# Patient Record
Sex: Female | Born: 1945
Health system: Southern US, Community
[De-identification: ages and names within clinical notes are randomized; demographics above are authoritative.]

## PROBLEM LIST (undated history)

## (undated) DIAGNOSIS — E039 Hypothyroidism, unspecified: Secondary | ICD-10-CM

## (undated) DIAGNOSIS — M8580 Other specified disorders of bone density and structure, unspecified site: Secondary | ICD-10-CM

## (undated) DIAGNOSIS — J189 Pneumonia, unspecified organism: Secondary | ICD-10-CM

## (undated) DIAGNOSIS — E079 Disorder of thyroid, unspecified: Secondary | ICD-10-CM

## (undated) DIAGNOSIS — K8010 Calculus of gallbladder with chronic cholecystitis without obstruction: Secondary | ICD-10-CM

## (undated) DIAGNOSIS — I341 Nonrheumatic mitral (valve) prolapse: Secondary | ICD-10-CM

## (undated) DIAGNOSIS — T7840XA Allergy, unspecified, initial encounter: Secondary | ICD-10-CM

## (undated) HISTORY — DX: Other specified disorders of bone density and structure, unspecified site: M85.80

## (undated) HISTORY — DX: Allergy, unspecified, initial encounter: T78.40XA

## (undated) HISTORY — PX: APPENDECTOMY: SHX54

## (undated) HISTORY — DX: Disorder of thyroid, unspecified: E07.9

## (undated) HISTORY — PX: TONSILLECTOMY AND ADENOIDECTOMY: SHX28

## (undated) HISTORY — PX: COLON SURGERY: SHX602

## (undated) HISTORY — PX: ABDOMINAL HYSTERECTOMY: SHX81

## (undated) HISTORY — DX: Nonrheumatic mitral (valve) prolapse: I34.1

---

## 1998-03-17 ENCOUNTER — Ambulatory Visit (HOSPITAL_COMMUNITY): Admission: RE | Admit: 1998-03-17 | Discharge: 1998-03-17 | Payer: Self-pay | Admitting: Obstetrics & Gynecology

## 1998-05-20 ENCOUNTER — Other Ambulatory Visit: Admission: RE | Admit: 1998-05-20 | Discharge: 1998-05-20 | Payer: Self-pay | Admitting: Obstetrics & Gynecology

## 1999-05-06 ENCOUNTER — Ambulatory Visit (HOSPITAL_COMMUNITY): Admission: RE | Admit: 1999-05-06 | Discharge: 1999-05-06 | Payer: Self-pay | Admitting: Obstetrics & Gynecology

## 1999-05-06 ENCOUNTER — Encounter: Payer: Self-pay | Admitting: Obstetrics & Gynecology

## 2000-05-16 ENCOUNTER — Encounter: Payer: Self-pay | Admitting: Obstetrics & Gynecology

## 2000-05-16 ENCOUNTER — Ambulatory Visit (HOSPITAL_COMMUNITY): Admission: RE | Admit: 2000-05-16 | Discharge: 2000-05-16 | Payer: Self-pay | Admitting: Obstetrics & Gynecology

## 2000-08-24 ENCOUNTER — Other Ambulatory Visit: Admission: RE | Admit: 2000-08-24 | Discharge: 2000-08-24 | Payer: Self-pay | Admitting: Obstetrics & Gynecology

## 2001-05-22 ENCOUNTER — Ambulatory Visit (HOSPITAL_COMMUNITY): Admission: RE | Admit: 2001-05-22 | Discharge: 2001-05-22 | Payer: Self-pay | Admitting: Obstetrics & Gynecology

## 2001-05-22 ENCOUNTER — Encounter: Payer: Self-pay | Admitting: Obstetrics & Gynecology

## 2001-08-30 ENCOUNTER — Other Ambulatory Visit: Admission: RE | Admit: 2001-08-30 | Discharge: 2001-08-30 | Payer: Self-pay | Admitting: Obstetrics & Gynecology

## 2002-07-09 ENCOUNTER — Ambulatory Visit (HOSPITAL_COMMUNITY): Admission: RE | Admit: 2002-07-09 | Discharge: 2002-07-09 | Payer: Self-pay | Admitting: Obstetrics & Gynecology

## 2002-07-09 ENCOUNTER — Encounter: Payer: Self-pay | Admitting: Obstetrics & Gynecology

## 2002-09-19 ENCOUNTER — Other Ambulatory Visit: Admission: RE | Admit: 2002-09-19 | Discharge: 2002-09-19 | Payer: Self-pay | Admitting: Obstetrics & Gynecology

## 2003-07-24 ENCOUNTER — Ambulatory Visit (HOSPITAL_COMMUNITY): Admission: RE | Admit: 2003-07-24 | Discharge: 2003-07-24 | Payer: Self-pay | Admitting: Obstetrics & Gynecology

## 2003-11-26 ENCOUNTER — Other Ambulatory Visit: Admission: RE | Admit: 2003-11-26 | Discharge: 2003-11-26 | Payer: Self-pay | Admitting: Obstetrics & Gynecology

## 2004-02-12 ENCOUNTER — Encounter (INDEPENDENT_AMBULATORY_CARE_PROVIDER_SITE_OTHER): Payer: Self-pay | Admitting: *Deleted

## 2004-03-11 ENCOUNTER — Encounter: Admission: RE | Admit: 2004-03-11 | Discharge: 2004-03-11 | Payer: Self-pay | Admitting: Endocrinology

## 2004-12-08 ENCOUNTER — Other Ambulatory Visit: Admission: RE | Admit: 2004-12-08 | Discharge: 2004-12-08 | Payer: Self-pay | Admitting: Obstetrics & Gynecology

## 2008-05-01 ENCOUNTER — Emergency Department (HOSPITAL_COMMUNITY): Admission: EM | Admit: 2008-05-01 | Discharge: 2008-05-01 | Payer: Self-pay | Admitting: Emergency Medicine

## 2008-05-12 ENCOUNTER — Encounter: Admission: RE | Admit: 2008-05-12 | Discharge: 2008-05-12 | Payer: Self-pay | Admitting: Family Medicine

## 2008-05-26 ENCOUNTER — Encounter: Admission: RE | Admit: 2008-05-26 | Discharge: 2008-05-26 | Payer: Self-pay | Admitting: Family Medicine

## 2008-06-16 ENCOUNTER — Encounter: Admission: RE | Admit: 2008-06-16 | Discharge: 2008-06-16 | Payer: Self-pay | Admitting: Family Medicine

## 2009-03-26 DIAGNOSIS — I059 Rheumatic mitral valve disease, unspecified: Secondary | ICD-10-CM | POA: Insufficient documentation

## 2009-03-26 DIAGNOSIS — E039 Hypothyroidism, unspecified: Secondary | ICD-10-CM | POA: Insufficient documentation

## 2009-05-01 ENCOUNTER — Ambulatory Visit: Payer: Self-pay | Admitting: Emergency Medicine

## 2009-05-01 DIAGNOSIS — R930 Abnormal findings on diagnostic imaging of skull and head, not elsewhere classified: Secondary | ICD-10-CM

## 2009-05-01 DIAGNOSIS — J189 Pneumonia, unspecified organism: Secondary | ICD-10-CM

## 2009-05-01 DIAGNOSIS — R0602 Shortness of breath: Secondary | ICD-10-CM | POA: Insufficient documentation

## 2009-05-13 LAB — CONVERTED CEMR LAB: A-1 Antitrypsin, Ser: 120 mg/dL (ref 83–200)

## 2009-05-21 ENCOUNTER — Ambulatory Visit: Payer: Self-pay | Admitting: Emergency Medicine

## 2009-05-27 ENCOUNTER — Encounter: Payer: Self-pay | Admitting: Emergency Medicine

## 2009-06-04 ENCOUNTER — Telehealth: Payer: Self-pay | Admitting: Emergency Medicine

## 2009-08-17 ENCOUNTER — Telehealth: Payer: Self-pay | Admitting: Emergency Medicine

## 2009-10-14 ENCOUNTER — Telehealth: Payer: Self-pay | Admitting: Gastroenterology

## 2009-12-04 ENCOUNTER — Encounter (INDEPENDENT_AMBULATORY_CARE_PROVIDER_SITE_OTHER): Payer: Self-pay | Admitting: *Deleted

## 2010-01-08 ENCOUNTER — Ambulatory Visit: Payer: Self-pay | Admitting: Gastroenterology

## 2010-01-08 ENCOUNTER — Encounter (INDEPENDENT_AMBULATORY_CARE_PROVIDER_SITE_OTHER): Payer: Self-pay | Admitting: *Deleted

## 2010-01-08 DIAGNOSIS — Z860100 Personal history of colon polyps, unspecified: Secondary | ICD-10-CM | POA: Insufficient documentation

## 2010-04-06 ENCOUNTER — Ambulatory Visit: Payer: Self-pay | Admitting: Gastroenterology

## 2010-04-06 ENCOUNTER — Telehealth: Payer: Self-pay | Admitting: Gastroenterology

## 2010-04-19 ENCOUNTER — Telehealth: Payer: Self-pay | Admitting: Gastroenterology

## 2010-08-15 ENCOUNTER — Encounter: Payer: Self-pay | Admitting: Family Medicine

## 2010-08-15 ENCOUNTER — Encounter: Payer: Self-pay | Admitting: Cardiology

## 2010-08-24 NOTE — Letter (Signed)
Summary: New Patient letter  [facility]  [address] [place], [place] [address]   Phone: [phone]  Fax: [phone]       12/04/2009 MRN: [id number]  Katelyn Anthony [address] [place], [place]  [address]  Dear Ms. Katelyn Anthony,  Welcome to the Gastroenterology Division at [facility].    You are scheduled to see Dr. Christella Hartigan on 01/08/2010 at 3:00PM on the 3rd floor at [facility], [address].  We ask that you try to arrive at our office 15 minutes prior to your appointment time to allow for check-in.  We would like you to complete the enclosed self-administered evaluation form prior to your visit and bring it with you on the day of your appointment.  We will review it with you.  Also, please bring a complete list of all your medications or, if you prefer, bring the medication bottles and we will list them.  Please bring your insurance card so that we may make a copy of it.  If your insurance requires a referral to see a specialist, please bring your referral form from your primary care physician.  Co-payments are due at the time of your visit and may be paid by cash, check or credit card.     Your office visit will consist of a consult with your physician (includes a physical exam), any laboratory testing he/she may order, scheduling of any necessary diagnostic testing (e.g. x-ray, ultrasound, CT-scan), and scheduling of a procedure (e.g. Endoscopy, Colonoscopy) if required.  Please allow enough time on your schedule to allow for any/all of these possibilities.    If you cannot keep your appointment, please call [phone] to cancel or reschedule prior to your appointment date.  This allows [place] the opportunity to schedule an appointment for another patient in need of care.  If you do not cancel or reschedule by 5 p.m. the business day prior to your appointment date, you will be charged a $50.00 late cancellation/no-show fee.    Thank you for choosing [facility] for your medical needs.  We appreciate the opportunity to care for you.  Please visit [place] at our website  to learn more about our practice.                     Sincerely,                                                             The Gastroenterology Division

## 2010-08-24 NOTE — Progress Notes (Signed)
Summary: Dr. Kandace Parkins  Phone Note Call from Patient Call back at Home Phone [phone]   Caller: Patient Call For: Dr. Christella Hartigan Reason for Call: Talk to Nurse Summary of Call: was Dr. Hilda Blades pt and now would like to be seen by someone new since Dr. Doreatha Martin has retired... pt has an immediate question as to why Dr. Doreatha Martin didnt think she needed a recall COL until 2013 when her last COL was 2006 and her mother passed from COL cancer pt also demanded that a doctor within our practice tell her where Dr. Maisie Fus Pullium is now practicing... told pt that Dr. Kandace Parkins is not listed in my physician directory and the only other thing I know to do is to research him online... pt said she has and there are "thousands of Dr. Cecilie Lowers" and she would like a simpler list or to be told where THE Dr. Kandace Parkins now practices... pt then says "I want you to go ask a doctor right now where Dr. Kandace Parkins is"... told pt that I would put her on hold and find a doctor that was not with a patient... without a doubt, pt was placed on hold and call was not dropped... picked the line back up to tell her that Dr. Leone Payor was in a room with a patient but that if she wantd to hold, I would ask when Dr. Leone Payor was out- pt was no longer on the line... pt then called back and was yelling that we had the worst and rudest staff and that "if I came up to your office I would fire each and every one of you" and "I dont know how yall even have a practice with such unprofessional staff"...  told pt that I was trying to help her as much as possible and that she hung up before I could get an answer from Dr. Leone Payor... told pt I would take a message if she didnt want to hold  Initial call taken by: Vallarie Mare,  October 14, 2009 2:07 PM  Follow-up for Phone Call        Dr Christella Hartigan do you want to take this pt?  Chales Abrahams [name] Duncan Dull)  October 14, 2009 2:12 PM   Additional Follow-up for Phone Call Additional follow up Details #1::        I do not know where Dr.  Kandace Parkins is and I am happy to see her as a new patient at my next available NGI appt to discuss her questions about colon cancer screening, surveillance, family history.  Additional Follow-up by: Rachael Fee MD,  October 14, 2009 2:40 PM    Additional Follow-up for Phone Call Additional follow up Details #2::    Revonda Standard he will accept this pt if you want to call her and get her scheduled, he does not know that Dr Melina Fiddler talking about. Follow-up by: Chales Abrahams [name] Duncan Dull),  October 14, 2009 2:54 PM  Additional Follow-up for Phone Call Additional follow up Details #3:: Details for Additional Follow-up Action Taken: Called pt and left a vm with our office phone number and information that Dr. Christella Hartigan will accept her as a new pt... also provided Dr. Christen Bame phone number [phone]) which I got from Dr. Arlyce Dice Additional Follow-up by: Vallarie Mare,  October 16, 2009 2:33 PM

## 2010-08-24 NOTE — Progress Notes (Signed)
Summary: CX Procedure  Phone Note Call from Patient Call back at Home Phone [phone]   Caller: Patient Call For: Dr Christella Hartigan Reason for Call: Talk to Doctor Details for Reason: Cx Procedure Summary of Call: Pt cancelled procedure for tomorrow. Stated she is "psychologically not prepared" and she is "too hungry" to prep today. Pt will callback to reschedule for 2012. Would you like to charge cancellation fee? Initial call taken by: Dwan Bolt,  April 06, 2010 1:36 PM  Follow-up for Phone Call        yes Follow-up by: Rachael Fee MD,  April 06, 2010 1:49 PM  Additional Follow-up for Phone Call Additional follow up Details #1::        Patient BILLED. Additional Follow-up by: Leanor Kail [facility],  April 19, 2010 1:19 PM

## 2010-08-24 NOTE — Assessment & Plan Note (Signed)
History of Present Illness Visit Type: Initial Visit Primary GI MD: Rob Bunting MD Primary Provider: Shaune Pollack, MD Chief Complaint: Katelyn Anthony hx of colon cancer and wants to discuss recall colon. Also has personal hx of polyps and diverticulosis.  History of Present Illness:     very pleasant 65 year old woman who has had adenomatous polyps in the past, also mother died from colon cancer.  she underwent a colonoscopy in 2005 with Dr. Corinda Gubler. No polyps were found at that time and she was told that she needs a colonoscopy at 8 year interval.  she recently brought this up with her gynecologist, Dr. Jennette Kettle, and she told him that she was concerned about this long interval. He sent her here for a visit.           Current Medications (verified): 1)  Synthroid 25 Mcg Tabs (Levothyroxine Sodium) .Marland Kitchen.. 1 1/2 By Mouth Daily 2)  Vivelle-Dot 0.075 Mg/24hr Pttw (Estradiol) .... Change Every 3-4 Days  Allergies (verified): 1)  ! Sulfa 2)  ! * Aleve 3)  ! Zithromax 4)  ! Augmentin 5)  ! Levaquin  Past History:  Past Medical History: PNEUMONIA (ICD-486) MITRAL VALVE PROLAPSE (ICD-424.0) HYPOTHYROIDISM (ICD-244.9) diverticulosis pre-cancerous colon polyps  Past Surgical History: Colon Resection July 1992, diverticlar complication; primarily anastomosed appendectomy 1992 hysterectomy  Family History: Family History Colon Cancer--mother in her late 81s lung ca-maternal aunt  Social History: Patient states former smoker. quit in 1970's.  <1/2ppd x39yrs.   alcohol occ single no children occupation-retail management  Review of Systems       Pertinent positive and negative review of systems were noted in the above HPI and GI specific review of systems.  All other review of systems was otherwise negative.   Vital Signs:  Patient profile:   65 year old female Height:      61.5 inches Weight:      88.50 pounds BMI:     16.51 Pulse rate:   74 / minute Pulse rhythm:    regular BP sitting:   118 / 64  (right arm) Cuff size:   74regular  Vitals Entered By: Christie Nottingham [name] Duncan Dull) (January 08, 2010 3:12 PM)  Physical Exam  Additional Exam:  Constitutional: generally well appearing Psychiatric: alert and oriented times 3 Eyes: extraocular movements intact Mouth: oropharynx moist, no lesions Neck: supple, no lymphadenopathy Cardiovascular: heart regular rate and rythm Lungs: CTA bilaterally Abdomen: soft, non-tender, non-distended, no obvious ascites, no peritoneal signs, normal bowel sounds Extremities: no lower extremity edema bilaterally Skin: no lesions on visible extremities    Impression & Recommendations:  Problem # 1:  Personal history of precancerous polyps, family history of colon cancer her previous gastroenterologist told her that she did not require colonoscopy for 8 years from her previous one in 2005. That is quite unusual interval for a person with a first-degree relative with colon cancer and personal history of adenomatous colon polyps. Currently accepted national polyp surveillance, colon screening guidelines recommended that she had a colonoscopy at five-year interval. We will therefore arrange for a colonoscopy to be done at her soonest convenience. I see no reason for any further blood tests or imaging studies.  Patient Instructions: 1)  You will be scheduled to have a colonoscopy. 2)  A copy of this information will be sent to Dr. Kevan Ny, Dr. Varney Baas. 3)  The medication list was reviewed and reconciled.  All changed / newly prescribed medications were explained.  A complete medication list was provided to  the patient / caregiver.  Appended Document: Orders Update    Clinical Lists Changes  Problems: Added new problem of COLONIC POLYPS, HX OF (ICD-V12.72) Medications: Added new medication of MOVIPREP 100 GM  SOLR (PEG-KCL-NACL-NASULF-NA ASC-C) As per prep instructions. - Signed Rx of MOVIPREP 100 GM  SOLR  (PEG-KCL-NACL-NASULF-NA ASC-C) As per prep instructions.;  #1 x 0;  Signed;  Entered by: Harlow Mares [name] (AAMA);  Authorized by: Rachael Fee MD;  Method used: Electronically to [id number] [address] [place], [place], [place]  [address], Ph: [phone], Fax: [phone] Orders: Added new Test order of Colonoscopy (Colon) - Signed    Prescriptions: MOVIPREP 100 GM  SOLR (PEG-KCL-NACL-NASULF-NA ASC-C) As per prep instructions.  #1 x 0   Entered by:   Harlow Mares [name] (AAMA)   Authorized by:   Rachael Fee MD   Signed by:   Harlow Mares [name] (AAMA) on 01/08/2010   Method used:   Electronically to        [id number] (retail)       [address]       [place], [place]  [address]       Ph: [phone]       Fax: [phone]   RxID:   [id number]

## 2010-08-24 NOTE — Letter (Signed)
Summary: [facility] Instructions  [facility]  [address] [place], [place] [address]   Phone: [phone]  Fax: [phone]       Katelyn Anthony    28-Sep-1945    MRN: [id number]        Procedure Day /Date: 04/07/2010 Wednesday     Arrival Time: 7:30am     Procedure Time: 8:30am     Location of Procedure:                    X  [facility] (4th Floor)                        PREPARATION FOR COLONOSCOPY WITH MOVIPREP   Starting 5 days prior to your procedure 04/02/2010 do not eat nuts, seeds, popcorn, corn, beans, peas,  salads, or any raw vegetables.  Do not take any fiber supplements (e.g. Metamucil, Citrucel, and Benefiber).  THE DAY BEFORE YOUR PROCEDURE         DATE: 04/06/2010  DAY: Tuesday  1.  Drink clear liquids the entire day-NO SOLID FOOD  2.  Do not drink anything colored red or purple.  Avoid juices with pulp.  No orange juice.  3.  Drink at least 64 oz. (8 glasses) of fluid/clear liquids during the day to prevent dehydration and help the prep work efficiently.  CLEAR LIQUIDS INCLUDE: Water Jello Ice Popsicles Tea (sugar ok, no milk/cream) Powdered fruit flavored drinks Coffee (sugar ok, no milk/cream) Gatorade Juice: apple, white grape, white cranberry  Lemonade Clear bullion, consomm, broth Carbonated beverages (any kind) Strained chicken noodle soup Hard Candy                             4.  In the morning, mix first dose of MoviPrep solution:    Empty 1 Pouch A and 1 Pouch B into the disposable container    Add lukewarm drinking water to the top line of the container. Mix to dissolve    Refrigerate (mixed solution should be used within 24 hrs)  5.  Begin drinking the prep at 5:00 p.m. The MoviPrep container is divided by 4 marks.   Every 15 minutes drink the solution down to the next mark (approximately 8 oz) until the full liter is complete.   6.  Follow completed prep with 16 oz of clear liquid of your choice (Nothing  red or purple).  Continue to drink clear liquids until bedtime.  7.  Before going to bed, mix second dose of MoviPrep solution:    Empty 1 Pouch A and 1 Pouch B into the disposable container    Add lukewarm drinking water to the top line of the container. Mix to dissolve    Refrigerate  THE DAY OF YOUR PROCEDURE      DATE: 04/07/2010 DAY: Thursday  Beginning at 3:30am (5 hours before procedure):         1. Every 15 minutes, drink the solution down to the next mark (approx 8 oz) until the full liter is complete.  2. Follow completed prep with 16 oz. of clear liquid of your choice.    3. You may drink clear liquids until 5:30am (2 HOURS BEFORE PROCEDURE).   MEDICATION INSTRUCTIONS  Unless otherwise instructed, you should take regular prescription medications with a small sip of water   as early as possible the morning of your procedure.  OTHER INSTRUCTIONS  You will need a responsible adult at least 65 years of age to accompany you and drive you home.   This person must remain in the waiting room during your procedure.  Wear loose fitting clothing that is easily removed.  Leave jewelry and other valuables at home.  However, you may wish to bring a book to read or  an iPod/MP3 player to listen to music as you wait for your procedure to start.  Remove all body piercing jewelry and leave at home.  Total time from sign-in until discharge is approximately 2-3 hours.  You should go home directly after your procedure and rest.  You can resume normal activities the  day after your procedure.  The day of your procedure you should not:   Drive   Make legal decisions   Operate machinery   Drink alcohol   Return to work  You will receive specific instructions about eating, activities and medications before you leave.    The above instructions have been reviewed and explained to me by   Harlow Mares [name] Duncan Dull)  January 08, 2010 3:41 PM      I fully understand  and can verbalize these instructions _____________________________ Date 01/08/2010

## 2010-08-24 NOTE — Progress Notes (Signed)
Summary: Question  Phone Note Call from Patient Call back at Home Phone [phone]   Caller: Patient Call For: Dr. Christella Hartigan Reason for Call: Talk to Nurse Summary of Call: Has some questions about the prep for a COL Initial call taken by: Karna Christmas,  April 19, 2010 4:17 PM  Follow-up for Phone Call        no answer on home phone Chales Abrahams [name] Duncan Dull)  April 20, 2010 8:05 AM   Spoke with the pt and she wanted to see if there were any other preps that you do not need to drink.  I advised her that there are no other preps available and the movi prep is the best prep for visualization.  She was very understandable and agreed to use the movi prep. Follow-up by: Chales Abrahams [name] Duncan Dull),  April 21, 2010 8:39 AM

## 2010-08-24 NOTE — Progress Notes (Signed)
Summary: Schedule appt with RB-appt has been made  ---- Converted from flag ---- ---- 08/14/2009 5:10 PM, Michel Bickers [name] wrote: Pt NOS f/u with RB to discuss PFT results. She needs to rsc. ------------------------------  Phone Note Outgoing Call   Call placed by: Michel Bickers [name],  August 17, 2009 1:06 PM Call placed to: Patient Summary of Call: LMTCB. Need to rechecule missed appt from 08/14/09 to discuss PFT results with Dr. Delton Coombes. Initial call taken by: Michel Bickers [name],  August 17, 2009 1:07 PM  Follow-up for Phone Call        [facility].Michel Bickers [name]  August 18, 2009 3:19 PM  [facility].Michel Bickers [facility]  August 20, 2009 4:49 PM  pt called back. i made a f/u appt w/ rb for pft results. pt listed "many days" that were good for her but were not avail. she will return for rov on fri 10/09/09. if this date is too far out - if dr Kyle Luppino wants her in sooner let me know. otherwise, pt will come in 10/09/09. Tivis Ringer, CNA  August 21, 2009 11:05 AM  Additional Follow-up for Phone Call Additional follow up Details #1::        I will forward to RB so he is aware.Michel Bickers [facility]  August 21, 2009 2:15 PM   Thanks Leslye Peer MD  August 25, 2009 3:27 PM  Additional Follow-up by: Leslye Peer MD,  August 25, 2009 3:27 PM

## 2010-08-24 NOTE — Procedures (Signed)
Summary: Colon   Colonoscopy  Procedure date:  02/12/2004  Findings:      Location:  [facility].   Patient Name: Katelyn Anthony, Katelyn Anthony MRN:  Procedure Procedures: Colonoscopy CPT: [id number].  Personnel: Endoscopist: Ulyess Mort, MD.  Exam Location: Exam performed in Outpatient Clinic. Outpatient  Patient Consent: Procedure, Alternatives, Risks and Benefits discussed, consent obtained, from patient. Consent was obtained by the RN.  Indications  Surveillance of: Adenomatous Polyp(s).  History  Pre-Exam Physical: Entire physical exam was normal.  Exam Exam: Extent of exam reached: Cecum, extent intended: Cecum.  The cecum was identified by appendiceal orifice and IC valve. Colon retroflexion performed. Images were not taken. ASA Classification: II. Tolerance: good.  Monitoring: Pulse and BP monitoring, Oximetry used. Supplemental O2 given.  Colon Prep Prep results: good.  Sedation Meds: Patient assessed and found to be appropriate for moderate (conscious) sedation. Fentanyl 50 mcg. given IV. Versed 7 mg. given IV.  Findings - PRIOR SURGERY: Hepatic Flexure. Right Hemicolectomy.  - DIVERTICULOSIS: Transverse Colon to Rectum. ICD9: Diverticulosis: 562.10. Comments: mild.   Assessment Abnormal examination, see findings above.  Diagnoses: 562.10: Diverticulosis.   Events  Unplanned Interventions: No intervention was required.  Unplanned Events: There were no complications. Plans Medication Plan: Continue current medications.  Patient Education: Patient given standard instructions for: Diverticulosis. Yearly hemoccult testing recommended. Patient instructed to get routine colonoscopy every 8 years.  Disposition: After procedure patient sent to recovery. After recovery patient sent home.

## 2010-09-28 ENCOUNTER — Encounter (INDEPENDENT_AMBULATORY_CARE_PROVIDER_SITE_OTHER): Payer: Self-pay | Admitting: *Deleted

## 2010-10-05 NOTE — Letter (Signed)
Summary: Pre Visit Letter Revised  [facility]  [address] [place], [place] [address]   Phone: [phone]  Fax: [phone]        09/28/2010 MRN: [id number] Katelyn Anthony [address] [place], [place]  [address]             Procedure Date:  11/09/2010 @ 10:30   Recall colon-Dr. Christella Hartigan   Welcome to the Gastroenterology Division at [facility].    You are scheduled to see a nurse for your pre-procedure visit on 10/15/2010 at 3:30 on the 3rd floor at [facility], [address]  We ask that you try to arrive at our office 15 minutes prior to your appointment time to allow for check-in.  Please take a minute to review the attached form.  If you answer "Yes" to one or more of the questions on the first page, we ask that you call the person listed at your earliest opportunity.  If you answer "No" to all of the questions, please complete the rest of the form and bring it to your appointment.    Your nurse visit will consist of discussing your medical and surgical history, your immediate family medical history, and your medications.   If you are unable to list all of your medications on the form, please bring the medication bottles to your appointment and we will list them.  We will need to be aware of both prescribed and over the counter drugs.  We will need to know exact dosage information as well.    Please be prepared to read and sign documents such as consent forms, a financial agreement, and acknowledgement forms.  If necessary, and with your consent, a friend or relative is welcome to sit-in on the nurse visit with you.  Please bring your insurance card so that we may make a copy of it.  If your insurance requires a referral to see a specialist, please bring your referral form from your primary care physician.  No co-pay is required for this nurse visit.     If you cannot keep your appointment, please call [phone] to cancel or reschedule prior to your  appointment date.  This allows [place] the opportunity to schedule an appointment for another patient in need of care.    Thank you for choosing [facility] for your medical needs.  We appreciate the opportunity to care for you.  Please visit [place] at our website  to learn more about our practice.  Sincerely, The Gastroenterology Division

## 2010-10-13 ENCOUNTER — Ambulatory Visit (AMBULATORY_SURGERY_CENTER): Payer: 59 | Admitting: *Deleted

## 2010-10-13 VITALS — Ht 62.0 in | Wt 88.0 lb

## 2010-10-13 DIAGNOSIS — Z86010 Personal history of colonic polyps: Secondary | ICD-10-CM

## 2010-10-13 MED ORDER — PEG-KCL-NACL-NASULF-NA ASC-C 100 G PO SOLR: 1.0000 | Freq: Once | ORAL | Status: AC

## 2010-10-14 ENCOUNTER — Encounter: Payer: Self-pay | Admitting: Gastroenterology

## 2010-10-15 ENCOUNTER — Encounter: Payer: Self-pay | Admitting: *Deleted

## 2010-11-09 ENCOUNTER — Ambulatory Visit: Payer: 59 | Admitting: Gastroenterology

## 2010-11-09 ENCOUNTER — Encounter: Payer: Self-pay | Admitting: Gastroenterology

## 2010-11-09 VITALS — BP 167/74 | HR 85 | Temp 98.0°F | Resp 20

## 2010-11-09 DIAGNOSIS — K5730 Diverticulosis of large intestine without perforation or abscess without bleeding: Secondary | ICD-10-CM

## 2010-11-09 DIAGNOSIS — Q438 Other specified congenital malformations of intestine: Secondary | ICD-10-CM

## 2010-11-09 DIAGNOSIS — Z800 Family history of malignant neoplasm of digestive organs: Secondary | ICD-10-CM

## 2010-11-09 DIAGNOSIS — Z86010 Personal history of colonic polyps: Secondary | ICD-10-CM

## 2010-11-09 MED ORDER — SODIUM CHLORIDE 0.9 % IV SOLN: 500.0000 mL | INTRAVENOUS | Status: DC

## 2010-11-09 NOTE — Patient Instructions (Signed)
Incomplete exam  Diverticulosis.  Dr. Christella Hartigan office will arrange for virtual colonoscopy.  See green and blue sheets for additional d/c instructions.

## 2010-11-10 ENCOUNTER — Telehealth: Payer: Self-pay | Admitting: *Deleted

## 2010-11-10 NOTE — Telephone Encounter (Signed)
ID on answering machine. Left message to call back if experiencing any problems or has questions

## 2010-11-11 ENCOUNTER — Telehealth: Payer: Self-pay

## 2010-11-11 DIAGNOSIS — Z800 Family history of malignant neoplasm of digestive organs: Secondary | ICD-10-CM

## 2010-11-11 NOTE — Telephone Encounter (Signed)
[facility] called to set up virtual colon recommended by Dr Christella Hartigan from her incomplete colon.

## 2010-11-11 NOTE — Telephone Encounter (Signed)
Records sent to [facility] for review and Insurance approval they will call the pt and our office if/when scheduled.

## 2010-11-12 ENCOUNTER — Other Ambulatory Visit: Payer: Self-pay | Admitting: Gastroenterology

## 2010-11-12 DIAGNOSIS — K5730 Diverticulosis of large intestine without perforation or abscess without bleeding: Secondary | ICD-10-CM

## 2010-11-17 ENCOUNTER — Other Ambulatory Visit: Payer: Self-pay | Admitting: Gastroenterology

## 2010-11-17 DIAGNOSIS — Z800 Family history of malignant neoplasm of digestive organs: Secondary | ICD-10-CM

## 2010-11-17 DIAGNOSIS — K635 Polyp of colon: Secondary | ICD-10-CM

## 2011-01-27 ENCOUNTER — Ambulatory Visit
Admission: RE | Admit: 2011-01-27 | Discharge: 2011-01-27 | Disposition: A | Discharge status: Home / Self Care | Payer: 59 | Source: Ambulatory Visit | Attending: Family Medicine | Admitting: Family Medicine

## 2011-01-27 ENCOUNTER — Other Ambulatory Visit: Payer: Self-pay | Admitting: Family Medicine

## 2011-01-27 DIAGNOSIS — R609 Edema, unspecified: Secondary | ICD-10-CM

## 2011-01-27 DIAGNOSIS — R52 Pain, unspecified: Secondary | ICD-10-CM

## 2011-03-03 ENCOUNTER — Telehealth: Payer: Self-pay | Admitting: Gastroenterology

## 2011-03-03 NOTE — Telephone Encounter (Signed)
lmom for pt to call back

## 2011-03-03 NOTE — Telephone Encounter (Signed)
Pt has questions about the Virtual Colonoscopy and what would we be looking for. Explained to pt during her COLON of 11/09/10, Dr Christella Hartigan was unable to complete her exam. He was able to examine the Sigmoid and Descending Segments, but d/t her redundant COLON and numerous position changes and different scopes, he could not examine the entire colon. Pt stated she had other procedures and polyps removed in the past and couldn't we do a "Sig" something that Dr Doreatha Martin did before. Explained her Sigmoid was ok per Dr Christella Hartigan notes. Read information to pt about the Virtual Colon and that air would be inserted through a rectal tube to expand the colon for viewing. Also informed her there are false positives and this exam isn't as reliable as the COLONOSCOPY. She wants to know if the Virtual is painful and if she's be "knocked out"? I will call about the procedure and let her know in am. Dr Christella Hartigan, pt wants to know what you will be looking for and if something is found, what is next? COLON with propofol? Please advise. Thanks.

## 2011-03-04 NOTE — Telephone Encounter (Signed)
She will not be knocked out during VC, may be slightly uncomfortable only.  If a signficant polyp or lesion is found, then another try at colonoscopy with propofol.

## 2011-03-04 NOTE — Telephone Encounter (Signed)
Spoke with Alvino Chapel at [facility] who stated a small tube is inserted into the rectum and the colon is filled with CO2 to expand the COLON and the tube is removed for the procedure. There is discomfort from the bloating only.  Per Dr Christella Hartigan' note, if anything is found he will try another COLON with Propofol. lmom for pt to call.

## 2011-03-04 NOTE — Telephone Encounter (Signed)
Informed pt of VC procedures and of Dr Christella Hartigan' note about a COLON with propofol. Pt was also concerned that Dr Christella Hartigan may no know of her Colectomy about 15 years ago. Explained to pt Dr Christella Hartigan mentioned in the COLON report done 11/09/10 that " the remote sigmoid anastomosis from complex diverticular disease was not visible on this exam" so he is aware of the surgery. Informed her she may have a little discomfort d/t the bloating, but she should be ok for the procedure. Pt will call to r/s the VC.

## 2011-04-20 ENCOUNTER — Other Ambulatory Visit: Payer: 59

## 2011-04-21 ENCOUNTER — Other Ambulatory Visit: Payer: 59

## 2011-09-21 ENCOUNTER — Inpatient Hospital Stay: Admission: RE | Admit: 2011-09-21 | Payer: 59 | Source: Ambulatory Visit

## 2011-09-23 ENCOUNTER — Ambulatory Visit
Admission: RE | Admit: 2011-09-23 | Discharge: 2011-09-23 | Disposition: A | Discharge status: Home / Self Care | Payer: 59 | Source: Ambulatory Visit | Attending: Gastroenterology | Admitting: Gastroenterology

## 2011-09-23 DIAGNOSIS — K5730 Diverticulosis of large intestine without perforation or abscess without bleeding: Secondary | ICD-10-CM

## 2011-10-18 ENCOUNTER — Ambulatory Visit (INDEPENDENT_AMBULATORY_CARE_PROVIDER_SITE_OTHER): Payer: 59 | Admitting: Gastroenterology

## 2011-10-18 ENCOUNTER — Encounter: Payer: Self-pay | Admitting: Gastroenterology

## 2011-10-18 DIAGNOSIS — Z86010 Personal history of colonic polyps: Secondary | ICD-10-CM

## 2011-10-18 DIAGNOSIS — Z809 Family history of malignant neoplasm, unspecified: Secondary | ICD-10-CM

## 2011-10-18 NOTE — Patient Instructions (Signed)
Recall colonoscopy in 5 years. Will try traditional colonoscopy given your very unpleasant experience with the Virtual Colonoscopy (would try with propofol sedation).

## 2011-10-18 NOTE — Progress Notes (Signed)
Review of pertinent gastrointestinal problems: 1. personal history of adenomatous polyps, mother had colon cancer. Most recent colonoscopy April 2012 was incomplete to 2 long tortuous colon. I could not advance the colonoscope past the transverse colon. I recommended followup virtual colonoscopy which she finally did about 11 months after I recommended it; no polyps were noted.   HPI: This is a very pleasant 66 year old woman whom I last saw about a year ago.   she finally went through with the virtual colonoscopy which I ordered for her about a year ago.  She had virtual CT last month, very uncomfortable.  She was not happy with the whole process, uncomfortable with the air.   No overt Gi bleeding, just her usual irregular.     Past Medical History  Diagnosis Date  . Allergy     feathers  . Cataract   . Thyroid disease   . Osteopenia   . MVP (mitral valve prolapse)     Past Surgical History  Procedure Date  . Abdominal hysterectomy   . Colon surgery     diverticulitis  . Tonsillectomy and adenoidectomy   . Appendectomy     Current Outpatient Prescriptions  Medication Sig Dispense Refill  . Cholecalciferol (VITAMIN D3) 2000 UNITS TABS Take 2,000 Units by mouth daily.        Marland Kitchen levothyroxine (SYNTHROID, LEVOTHROID) 25 MCG tablet Take 40.5 mcg by mouth daily.        Marland Kitchen VIVELLE-DOT 0.075 MG/24HR         Allergies as of 10/18/2011 - Review Complete 10/18/2011  Allergen Reaction Noted  . Azithromycin    . ZOX:[id number] acid+aspartame    . Levofloxacin    . Naproxen sodium    . Sulfonamide derivatives      Family History  Problem Relation Age of Onset  . Colon cancer Mother     History   Social History  . Marital Status: Single    Spouse Name: N/A    Number of Children: N/A  . Years of Education: N/A   Occupational History  . Not on file.   Social History Main Topics  . Smoking status: Former Games developer  . Smokeless tobacco: Not on file  .  Alcohol Use: 1.2 oz/week    2 Glasses of wine per week  . Drug Use: No  . Sexually Active: Not on file   Other Topics Concern  . Not on file   Social History Narrative  . No narrative on file      Physical Exam: BP 132/78  Pulse 60  Ht 5\' 1"  (1.549 m)  Wt 90 lb (40.824 kg)  BMI 17.01 kg/m2 Constitutional: generally well-appearing Psychiatric: alert and oriented x3 Abdomen: soft, nontender, nondistended, no obvious ascites, no peritoneal signs, normal bowel sounds     Assessment and plan: 66 y.o. female with  elevated risk for colon cancer given the personal history of polyps and family history of colon cancer  The virtual colonoscopy was a completely miserable experience for her. I think we will try again at standard endoscopic colonoscopy in 5 years from now. At that time I would at least use deeper sedation to try to get all the way around her colon. She knows that if she has any significant changes in her bowel habits or serious bleeding to call sooner.

## 2012-02-02 ENCOUNTER — Encounter: Payer: Self-pay | Admitting: Gastroenterology

## 2013-01-23 ENCOUNTER — Telehealth: Payer: Self-pay | Admitting: *Deleted

## 2013-01-23 NOTE — Telephone Encounter (Signed)
She needs to continue the same dose, she may feel tired for a few days since he is coming off very high doses. Do not  change until another 2-3 weeks

## 2013-01-23 NOTE — Telephone Encounter (Signed)
Left message on home phone with instructions

## 2013-01-23 NOTE — Telephone Encounter (Signed)
Pt called asking if Dr Lucianne Muss would agree for her to take her Synthroid 25 mg every other day, instead of 1/2 tablet every day. Pt states she is still feeling draggy and tired. Please return call to [phone]. Please advise.

## 2013-01-24 ENCOUNTER — Ambulatory Visit: Payer: 59 | Admitting: Endocrinology

## 2013-02-15 ENCOUNTER — Other Ambulatory Visit: Payer: Self-pay | Admitting: Endocrinology

## 2013-02-15 ENCOUNTER — Telehealth: Payer: Self-pay | Admitting: *Deleted

## 2013-02-15 NOTE — Telephone Encounter (Signed)
Need to see the actual labs

## 2013-02-15 NOTE — Telephone Encounter (Signed)
Left message on voicemail to return call and have her labs faxed over here.

## 2013-02-15 NOTE — Telephone Encounter (Signed)
Patient had labs done at [facility] and they told her that her TSH was low, she said half of the .125 isn't enough and she wants to know if she can take the .100?

## 2013-02-19 ENCOUNTER — Telehealth: Payer: Self-pay | Admitting: Endocrinology

## 2013-02-19 NOTE — Telephone Encounter (Signed)
Lab results from 02/06/13 showed TSH 0.07  She is getting too much medication, need to know exactly what dosage of Synthroid she is taking

## 2013-02-19 NOTE — Telephone Encounter (Signed)
Left message to return phone call.

## 2013-02-20 NOTE — Telephone Encounter (Signed)
Please confirm that she has been on half tablet a day since the level is very high  If she has been on half for at least 3 weeks she will reduced the dose to 50 mcg, 1 daily   Need to see her in 4 weeks with labs

## 2013-02-20 NOTE — Telephone Encounter (Signed)
Pt states she is taking a half of the .125 mg

## 2013-02-25 ENCOUNTER — Telehealth: Payer: Self-pay | Admitting: *Deleted

## 2013-02-25 NOTE — Telephone Encounter (Signed)
TSH being low means she is getting too much medication, she will need to take 50 mcg daily and see me in about a month with labs

## 2013-02-25 NOTE — Telephone Encounter (Signed)
Pt says she doesn't want you to go off the results of the tests you did, she said [facility] did her TSH and she said she wanted you to look at those labs and then decide what dose she should be on.

## 2013-02-26 ENCOUNTER — Other Ambulatory Visit: Payer: Self-pay | Admitting: *Deleted

## 2013-02-26 MED ORDER — LEVOTHYROXINE SODIUM 50 MCG PO TABS: 50.0000 ug | ORAL_TABLET | Freq: Every day | ORAL | Status: DC

## 2013-02-26 NOTE — Telephone Encounter (Signed)
Message left, rx sent.

## 2013-02-28 ENCOUNTER — Telehealth: Payer: Self-pay | Admitting: *Deleted

## 2013-02-28 NOTE — Telephone Encounter (Signed)
Left message for patient

## 2013-02-28 NOTE — Telephone Encounter (Signed)
Katelyn Anthony said she has been on the 50's before and was unable to sleep while she was on it, she wants to know if she can take the 100 mcg instead? Please advise

## 2013-02-28 NOTE — Telephone Encounter (Signed)
She cannot increase her medication right now because she was getting too much medication on the 62.5 mcg daily.

## 2013-04-11 ENCOUNTER — Other Ambulatory Visit: Payer: Self-pay | Admitting: *Deleted

## 2013-04-11 ENCOUNTER — Other Ambulatory Visit (INDEPENDENT_AMBULATORY_CARE_PROVIDER_SITE_OTHER): Payer: 59

## 2013-04-11 DIAGNOSIS — E039 Hypothyroidism, unspecified: Secondary | ICD-10-CM

## 2013-04-11 LAB — T4, FREE: Free T4: 0.81 ng/dL (ref 0.60–1.60)

## 2013-04-11 LAB — TSH: TSH: 2.78 u[IU]/mL (ref 0.35–5.50)

## 2013-04-12 ENCOUNTER — Ambulatory Visit (INDEPENDENT_AMBULATORY_CARE_PROVIDER_SITE_OTHER): Payer: 59 | Admitting: Endocrinology

## 2013-04-12 ENCOUNTER — Encounter: Payer: Self-pay | Admitting: Endocrinology

## 2013-04-12 VITALS — BP 138/80 | HR 56 | Temp 98.5°F | Resp 10 | Ht 61.5 in | Wt 90.5 lb

## 2013-04-12 DIAGNOSIS — E039 Hypothyroidism, unspecified: Secondary | ICD-10-CM

## 2013-04-12 DIAGNOSIS — L659 Nonscarring hair loss, unspecified: Secondary | ICD-10-CM

## 2013-04-12 NOTE — Progress Notes (Signed)
Patient ID: ANTONAE ZBIKOWSKI, female   DOB: [birth year], 67 y.o.   MRN: [id number]  Reason for Appointment:  Hypothyroidism, followup visit    History of Present Illness:   The hypothyroidism was first diagnosed in 2001 presenting with fatigue She had mild hypothyroidism at that time and could not tolerate more than a 25 mcg dose because of insomnia and palpitations Also at that time she had a twice normal enlarged thyroid gland However over the last year or so she has regular required larger doses for supplementation. Highest TSH has been about 9.7  She was seen several months ago and because of fatigue and high TSH was told to try a half of 125 mcg supplement However her TSH was low with this and she was told to go back to the 50 mcg On her own she had been taking the 125 for some time by mistake and she felt more energetic with this  Complaints are reported by the patient now are some fatigue. Also has had hair loss for quite some time but more in the last 2 months including some clumps of hair coming out               Compliance with the medical regimen has been as prescribed with taking the tablet in the morning before breakfast with water.  Appointment on 04/11/2013  Component Date Value Range Status  . Free T4 04/11/2013 0.81  0.60 - 1.60 ng/dL Final  . TSH 40/98/1191 2.78  0.35 - 5.50 uIU/mL Final      Medication List       This list is accurate as of: 04/12/13 11:59 PM.  Always use your most recent med list.               levothyroxine 50 MCG tablet  Commonly known as:  SYNTHROID, LEVOTHROID  Take 1 tablet (50 mcg total) by mouth daily.     Vitamin D3 2000 UNITS Tabs  Take 2,000 Units by mouth daily.     VIVELLE-DOT 0.075 MG/24HR  Generic drug:  estradiol        Past Medical History  Diagnosis Date  . Allergy     feathers  . Cataract   . Thyroid disease   . Osteopenia   . MVP (mitral valve prolapse)     Past Surgical History  Procedure Laterality Date  .  Abdominal hysterectomy    . Colon surgery      diverticulitis  . Tonsillectomy and adenoidectomy    . Appendectomy      Family History  Problem Relation Age of Onset  . Colon cancer Mother     Social History:  reports that she has quit smoking. She does not have any smokeless tobacco history on file. She reports that she drinks about 1.2 ounces of alcohol per week. She reports that she does not use illicit drugs.  Allergies:  Allergies  Allergen Reactions  . Amoxicillin-Pot Clavulanate     REACTION: diarrhea, muscle pain  . Azithromycin     REACTION: hives  . Levofloxacin     REACTION: itchy skin and bloody stools  . Naproxen Sodium     REACTION: rash  . Sulfonamide Derivatives     combative   ROS  No insomnia    Examination:   BP 138/80  Pulse 56  Temp(Src) 98.5 F (36.9 C)  Resp 10  Ht 5' 1.5" (1.562 m)  Wt 90 lb 8 oz (41.051 kg)  BMI 16.83 kg/m2  SpO2 98%   GENERAL APPEARANCE: Alert And looks well.    FACE: No puffiness of face. No swelling of hands    NECK: right side of thyroid is enlarged, nodular with mostly a 2 cm nodule felt          NEUROLOGIC EXAM: DTRs 2+ bilaterally at biceps.no tremor    Assessments   Hypothyroidism, long-standing associated with nodular goiter  Currently she is euthyroid with normal TSH while taking 50 mcg Her right-sided thyroid enlargement is relatively smaller than before HAIR loss: this may be related to her thyroid levels fluctuating significantly and she having some iatrogenic hyperthyroidism earlier this year   Treatment:   Continue same dosage before breakfast daily. Avoid taking any calcium or iron supplements with the thyroid supplement. If the hair loss is not better in a couple of months she can see a dermatologist    [facility] 04/14/2013, 9:55 PM

## 2013-04-15 ENCOUNTER — Ambulatory Visit: Payer: 59 | Admitting: Endocrinology

## 2013-04-25 ENCOUNTER — Other Ambulatory Visit: Payer: Self-pay | Admitting: *Deleted

## 2013-04-25 ENCOUNTER — Telehealth: Payer: Self-pay | Admitting: Endocrinology

## 2013-04-25 MED ORDER — LEVOTHYROXINE SODIUM 50 MCG PO TABS: 50.0000 ug | ORAL_TABLET | Freq: Every day | ORAL | Status: DC

## 2013-04-25 NOTE — Telephone Encounter (Signed)
Refill, Synthroid. Has already contacted pharmacy. Pharmacy is faxing info / [facility]. Patient has asked a return call / Sherri

## 2013-04-25 NOTE — Telephone Encounter (Signed)
rx sent

## 2013-07-28 ENCOUNTER — Other Ambulatory Visit: Payer: Self-pay | Admitting: Endocrinology

## 2013-07-30 ENCOUNTER — Other Ambulatory Visit (INDEPENDENT_AMBULATORY_CARE_PROVIDER_SITE_OTHER): Payer: 59

## 2013-07-30 DIAGNOSIS — E039 Hypothyroidism, unspecified: Secondary | ICD-10-CM

## 2013-07-30 LAB — T4, FREE: FREE T4: 0.78 ng/dL (ref 0.60–1.60)

## 2013-07-30 LAB — TSH: TSH: 2.25 u[IU]/mL (ref 0.35–5.50)

## 2013-08-01 ENCOUNTER — Ambulatory Visit (INDEPENDENT_AMBULATORY_CARE_PROVIDER_SITE_OTHER): Payer: 59 | Admitting: Endocrinology

## 2013-08-01 ENCOUNTER — Encounter: Payer: Self-pay | Admitting: Endocrinology

## 2013-08-01 ENCOUNTER — Other Ambulatory Visit: Payer: 59 | Admitting: *Deleted

## 2013-08-01 VITALS — BP 120/62 | HR 53 | Temp 98.0°F | Resp 12 | Ht 61.5 in | Wt 92.5 lb

## 2013-08-01 DIAGNOSIS — E039 Hypothyroidism, unspecified: Secondary | ICD-10-CM

## 2013-08-01 DIAGNOSIS — E042 Nontoxic multinodular goiter: Secondary | ICD-10-CM

## 2013-08-01 DIAGNOSIS — Z23 Encounter for immunization: Secondary | ICD-10-CM

## 2013-08-01 NOTE — Patient Instructions (Signed)
Continue same dosage before breakfast daily.   Avoid taking any calcium or iron supplements with the thyroid supplement. 

## 2013-08-01 NOTE — Progress Notes (Signed)
Patient ID: Katelyn Anthony, female   DOB: [birth year], 68 y.o.   MRN: [id number]  Reason for Appointment:  Hypothyroidism, followup visit    History of Present Illness:   The hypothyroidism was first diagnosed in 2001 presenting with fatigue She had mild hypothyroidism at that time and could not tolerate more than a 25 mcg dose because of insomnia and palpitations Also at that time she had a twice normal enlarged nodular thyroid gland However since about 2013 she has regular required larger doses of levothyroxine for supplementation.  Highest TSH has been 9.7 previously  She had a high TSH on 50 mcg in 2014 and was given a trial of half of 125 mcg supplement On her own she had been taking the full tablet of 125 mcg  by mistake and she felt more energetic with this. However because of low TSH this was changed back to 50 mcg and TSH was normal on this in 03/2013  Complaints are reported by the patient now are some fatigue which is unchanged.  Also has had hair loss for quite some time which is not improving. Has not seen dermatologist as advised          Compliance with the medical regimen has been as prescribed with taking the tablet in the morning before breakfast with water.  Appointment on 07/30/2013  Component Date Value Range Status  . TSH 07/30/2013 2.25  0.35 - 5.50 uIU/mL Final  . Free T4 07/30/2013 0.78  0.60 - 1.60 ng/dL Final      Medication List       This list is accurate as of: 08/01/13  4:08 PM.  Always use your most recent med list.               levothyroxine 50 MCG tablet  Commonly known as:  SYNTHROID, LEVOTHROID  TAKE 1 TABLET BY MOUTH ONCE DAILY     VIVELLE-DOT 0.075 MG/24HR  Generic drug:  estradiol        Past Medical History  Diagnosis Date  . Allergy     feathers  . Cataract   . Thyroid disease   . Osteopenia   . MVP (mitral valve prolapse)     Past Surgical History  Procedure Laterality Date  . Abdominal hysterectomy    . Colon surgery       diverticulitis  . Tonsillectomy and adenoidectomy    . Appendectomy      Family History  Problem Relation Age of Onset  . Colon cancer Mother     Social History:  reports that she has quit smoking. She does not have any smokeless tobacco history on file. She reports that she drinks about 1.2 ounces of alcohol per week. She reports that she does not use illicit drugs.  Allergies:  Allergies  Allergen Reactions  . Amoxicillin-Pot Clavulanate     REACTION: diarrhea, muscle pain  . Azithromycin     REACTION: hives  . Levofloxacin     REACTION: itchy skin and bloody stools  . Naproxen Sodium     REACTION: rash  . Sulfonamide Derivatives     combative   ROS  No insomnia recently  She continues on HRT from gynecologist   Examination:   BP 120/62  Pulse 53  Temp(Src) 98 F (36.7 C)  Resp 12  Ht 5' 1.5" (1.562 m)  Wt 92 lb 8 oz (41.958 kg)  BMI 17.20 kg/m2  SpO2 99%   GENERAL APPEARANCE: Alert And looks well.  No swelling of hands or feet     NECK: right side of thyroid is enlarged, nodular with a  2 cm nodule felt          NEUROLOGIC EXAM: DTRs 2+ bilaterally at biceps with normal relaxation    Assessments   Hypothyroidism, long-standing associated with nodular goiter  She is again  euthyroid with normal TSH while taking 50 mcg She tends to have continued fatigue which is unlikely to be from her hypothyroidism Her right-sided thyroid enlargement is stable  HAIR loss: Etiology is unclear and is persistent   Treatment:   Continue same dosage before breakfast daily. Avoid taking any calcium or iron supplements with the thyroid supplement.   Try OTC minoxidil for hair loss    Veronia Laprise 08/01/2013, 4:08 PM

## 2013-08-27 ENCOUNTER — Other Ambulatory Visit: Payer: Self-pay | Admitting: Endocrinology

## 2014-02-19 ENCOUNTER — Other Ambulatory Visit: Payer: Self-pay | Admitting: Endocrinology

## 2014-02-26 ENCOUNTER — Telehealth: Payer: Self-pay | Admitting: Endocrinology

## 2014-02-26 ENCOUNTER — Other Ambulatory Visit: Payer: Self-pay | Admitting: *Deleted

## 2014-02-26 MED ORDER — LEVOTHYROXINE SODIUM 50 MCG PO TABS: ORAL_TABLET | ORAL | Status: DC

## 2014-02-26 NOTE — Telephone Encounter (Signed)
rx sent again

## 2014-02-26 NOTE — Telephone Encounter (Signed)
Pt is totally out of her synthyroid branded call into [facility] asap-saw you called in on 02/19/14 but they stated they did not receive

## 2014-08-19 ENCOUNTER — Other Ambulatory Visit: Payer: Self-pay | Admitting: Endocrinology

## 2014-09-29 ENCOUNTER — Other Ambulatory Visit: Payer: Self-pay | Admitting: *Deleted

## 2014-09-29 MED ORDER — SYNTHROID 50 MCG PO TABS: 50.0000 ug | ORAL_TABLET | Freq: Every day | ORAL | Status: DC

## 2014-10-08 ENCOUNTER — Encounter: Payer: Self-pay | Admitting: Endocrinology

## 2014-10-08 ENCOUNTER — Ambulatory Visit (INDEPENDENT_AMBULATORY_CARE_PROVIDER_SITE_OTHER): Payer: 59 | Admitting: Endocrinology

## 2014-10-08 VITALS — BP 132/68 | HR 65 | Temp 97.6°F | Resp 12 | Wt 94.0 lb

## 2014-10-08 DIAGNOSIS — E039 Hypothyroidism, unspecified: Secondary | ICD-10-CM

## 2014-10-08 DIAGNOSIS — E042 Nontoxic multinodular goiter: Secondary | ICD-10-CM

## 2014-10-08 NOTE — Progress Notes (Signed)
Patient ID: Katelyn Anthony, female   DOB: 10/27/1945, 69 y.o.   MRN: [id number]  Reason for Appointment:  Hypothyroidism, followup visit    History of Present Illness:   Tried 75 for 2--3 days  The hypothyroidism was first diagnosed in 2001 presenting with fatigue She had mild hypothyroidism at that time and could not tolerate more than a 25 mcg dose because of insomnia and palpitations Also at that time she had a twice normal enlarged nodular thyroid gland However since about 2013 she has regular required larger doses of levothyroxine for supplementation.  Highest TSH has been 9.7 previously  She had a high TSH on 50 mcg in 2014 and was given a trial of half of 125 mcg supplement On her own she had been taking the full tablet of 125 mcg  by mistake and she felt more energetic with this. However because of low TSH this was changed back to 50 mcg and TSH was normal on this in 03/2013  She has not been seen in follow-up for over a year now.  However when she had her annual exam with her PCP her TSH was 5.87 in 1/16  Complaints are reported by the patient now are some fatigue which is unchanged from previously.  Also has chronic cold intolerance which is not any worse  She was told to take 75 g by her PCP but with this she could not sleep well and stopped this after 2 or 3 days even though she had some more energy She is now here for follow-up        Compliance with the medical regimen has been as prescribed with taking the tablet in the morning before breakfast with water.  No visits with results within 1 Week(s) from this visit. Latest known visit with results is:  Appointment on 07/30/2013  Component Date Value Ref Range Status  . TSH 07/30/2013 2.25  0.35 - 5.50 uIU/mL Final  . Free T4 07/30/2013 0.78  0.60 - 1.60 ng/dL Final      Medication List       This list is accurate as of: 10/08/14  3:59 PM.  Always use your most recent med list.               levothyroxine 50 MCG tablet  Commonly known as:  SYNTHROID  TAKE 1 TABLET BY MOUTH EVERY DAY     SYNTHROID 50 MCG tablet  Generic drug:  levothyroxine  Take 1 tablet (50 mcg total) by mouth daily.     VIVELLE-DOT 0.075 MG/24HR  Generic drug:  estradiol        Past Medical History  Diagnosis Date  . Allergy     feathers  . Cataract   . Thyroid disease   . Osteopenia   . MVP (mitral valve prolapse)     Past Surgical History  Procedure Laterality Date  . Abdominal hysterectomy    . Colon surgery      diverticulitis  . Tonsillectomy and adenoidectomy    . Appendectomy      Family History  Problem Relation Age of Onset  . Colon cancer Mother     Social History:  reports that she has quit smoking. She does not have any smokeless tobacco history on file. She reports that she drinks about 1.2 oz of alcohol per week. She reports that she does not use illicit drugs.  Allergies:  Allergies  Allergen Reactions  . Amoxicillin-Pot Clavulanate     REACTION:  diarrhea, muscle pain  . Azithromycin     REACTION: hives  . Levofloxacin     REACTION: itchy skin and bloody stools  . Naproxen Sodium     REACTION: rash  . Sulfonamide Derivatives     combative   ROS  She was told by her PCP to have hyperlipidemia and needed a statin drug but discussed with patient that her LDL and HDL are quite normal and she does not have any other risk factors  She continues on HRT from gynecologist with transdermal preparation   Examination:   BP 132/68 mmHg  Pulse 65  Temp(Src) 97.6 F (36.4 C) (Oral)  Resp 12  Wt 94 lb (42.638 kg)  SpO2 97%  She looks well.     No swelling of hands or puffiness of her eyes     NECK: right side of thyroid is mildly enlarged, nodular with a  2 cm nodule felt          NEUROLOGIC EXAM:  reflexes bilaterally at biceps with normal relaxation    Assessments   Hypothyroidism, long-standing associated with nodular goiter  She tends to have mild  persistent fatigue which is unlikely to be from her hypothyroidism In the past she has not consistently tolerated higher doses of levothyroxine even when TSH is slightly high because of side effects of insomnia with larger doses Also her TSH has been inconsistent in the last 2 years or so. Her right-sided thyroid enlargement is stable and has a similar size nodule    Plan:  Since her TSH in the past has been inconsistent and difficult to know if she has any symptoms related to hypothyroidism at this time will recheck her TSH to confirm the levels before making any changes May consider increasing the dose by 50 g per week as suggested by PCP  [facility] 10/08/2014, 3:59 PM

## 2014-10-09 ENCOUNTER — Other Ambulatory Visit: Payer: Self-pay

## 2014-10-09 LAB — TSH: TSH: 6.22 u[IU]/mL — ABNORMAL HIGH (ref 0.35–4.50)

## 2014-10-09 MED ORDER — SYNTHROID 50 MCG PO TABS: 50.0000 ug | ORAL_TABLET | Freq: Every day | ORAL | Status: DC

## 2014-10-09 NOTE — Progress Notes (Signed)
Quick Note:  Please let patient know that the thyroid levels still looks low, needs to get a new 50 g prescription with a half extra tablet twice a week Need to reschedule her follow-up appointment for 3 months instead of 6 months with lab  ______

## 2014-10-10 ENCOUNTER — Telehealth: Payer: Self-pay | Admitting: Endocrinology

## 2014-10-10 MED ORDER — SYNTHROID 50 MCG PO TABS: 50.0000 ug | ORAL_TABLET | Freq: Every day | ORAL | Status: DC

## 2014-10-10 NOTE — Telephone Encounter (Signed)
Rx sent for a 90 day supply  

## 2014-10-10 NOTE — Telephone Encounter (Signed)
Patient called stating that she needs her thyroid Rx sent to her pharmacy  The insurance will not cover if its not 90 day supply  The patient then stated that if the dispense is written differently; insurance should cover   Please advise with the patient    Thank you   Call back after 10 am [phone]

## 2014-12-23 ENCOUNTER — Ambulatory Visit
Admission: RE | Admit: 2014-12-23 | Discharge: 2014-12-23 | Disposition: A | Discharge status: Home / Self Care | Payer: 59 | Source: Ambulatory Visit | Attending: Family Medicine | Admitting: Family Medicine

## 2014-12-23 ENCOUNTER — Other Ambulatory Visit: Payer: Self-pay | Admitting: Family Medicine

## 2014-12-23 DIAGNOSIS — R1011 Right upper quadrant pain: Secondary | ICD-10-CM

## 2014-12-29 ENCOUNTER — Ambulatory Visit: Payer: Self-pay | Admitting: Surgery

## 2014-12-29 NOTE — H&P (Signed)
Katelyn Anthony 12/29/2014 3:27 PM Location: [facility] Patient #: [id number] DOB: 1945/10/08 Single / Language: Katelyn Anthony / Race: White Female History of Present Illness Katelyn Moores A. Maanya Hippert MD; 12/29/2014 4:37 PM) Patient words: gallbladder  pt with 2 week historyof epigastric pain after fatty meals. Pain is shrp and lastminutes to hours and gets better with time. Fat free diet has helped. U/S done and shows stones in gallblader without complicating features. Pt sent at the request of DR GATES.  The patient is a 69 year old female   Other Problems Katelyn Anthony, Katelyn Anthony; 12/29/2014 3:28 PM) Cholelithiasis Diverticulosis Lump In Breast Thyroid Disease  Past Surgical History Katelyn Anthony; 12/29/2014 3:28 PM) Appendectomy Colon Polyp Removal - Colonoscopy Hysterectomy (not due to cancer) - Partial Oral Surgery Resection of Small Bowel Tonsillectomy  Diagnostic Studies History Katelyn Anthony; 12/29/2014 3:28 PM) Colonoscopy 5-10 years ago Mammogram within last year Pap Smear 1-5 years ago  Allergies Katelyn Anthony; 12/29/2014 3:28 PM) No Known Drug Allergies 12/29/2014  Medication History Katelyn Anthony; 12/29/2014 3:29 PM) Fluticasone Propionate (50MCG/ACT Suspension, Nasal) Active. Omeprazole (40MG  Capsule DR, Oral) Active. Synthroid (50MCG Tablet, Oral) Active. Estrace (1MG  Tablet, Oral) Active. Medications Reconciled  Social History Katelyn Anthony, [name]; 12/29/2014 3:28 PM) Alcohol use Moderate alcohol use. Caffeine use Carbonated beverages, Coffee. No drug use Tobacco use Former smoker.  Family History Katelyn Anthony, [name]; 12/29/2014 3:28 PM) Arthritis Father, Mother. Colon Cancer Mother. Colon Polyps Father. Heart disease in female family member before age 26 Ischemic Bowel Disease Father.  Pregnancy / Birth History Katelyn Anthony, [name]; 12/29/2014 3:28 PM) Age at menarche 69 years. Age of menopause 51-55 Contraceptive History Oral  contraceptives. Gravida 0 Maternal age <15 Para 0     Review of Systems Katelyn Anthony Anthony; 12/29/2014 3:28 PM) General Not Present- Appetite Loss, Chills, Fatigue, Fever, Night Sweats, Weight Gain and Weight Loss. Skin Present- Dryness. Not Present- Change in Wart/Mole, Hives, Jaundice, New Lesions, Non-Healing Wounds, Rash and Ulcer. HEENT Present- Hoarseness, Visual Disturbances and Wears glasses/contact lenses. Not Present- Earache, Hearing Loss, Nose Bleed, Oral Ulcers, Ringing in the Ears, Seasonal Allergies, Sinus Pain, Sore Throat and Yellow Eyes. Respiratory Not Present- Bloody sputum, Chronic Cough, Difficulty Breathing, Snoring and Wheezing. Breast Not Present- Breast Mass, Breast Pain, Nipple Discharge and Skin Changes. Cardiovascular Not Present- Chest Pain, Difficulty Breathing Lying Down, Leg Cramps, Palpitations, Rapid Heart Rate, Shortness of Breath and Swelling of Extremities. Gastrointestinal Present- Constipation. Not Present- Abdominal Pain, Bloating, Bloody Stool, Change in Bowel Habits, Chronic diarrhea, Difficulty Swallowing, Excessive gas, Gets full quickly at meals, Hemorrhoids, Indigestion, Nausea, Rectal Pain and Vomiting. Female Genitourinary Not Present- Frequency, Nocturia, Painful Urination, Pelvic Pain and Urgency. Musculoskeletal Present- Muscle Weakness. Not Present- Back Pain, Joint Pain, Joint Stiffness, Muscle Pain and Swelling of Extremities. Neurological Not Present- Decreased Memory, Fainting, Headaches, Numbness, Seizures, Tingling, Tremor, Trouble walking and Weakness. Psychiatric Not Present- Anxiety, Bipolar, Change in Sleep Pattern, Depression, Fearful and Frequent crying. Endocrine Present- Cold Intolerance and Hair Changes. Not Present- Excessive Hunger, Heat Intolerance, Hot flashes and New Diabetes. Hematology Not Present- Easy Bruising, Excessive bleeding, Gland problems, HIV and Persistent Infections.  Vitals Katelyn Anthony Anthony; 12/29/2014 3:29  PM) 12/29/2014 3:29 PM Weight: 92.4 lb Height: 62.5in Body Surface Area: 1.36 m Body Mass Index: 16.63 kg/m Temp.: 98.55F(Oral)  Pulse: 61 (Regular)  Resp.: 17 (Unlabored)  BP: 116/66 (Sitting, Left Arm, Standard)     Physical Exam (Katelyn Ravi A. Kamrie Fanton MD; 12/29/2014 4:37 PM)  General  Mental Status-Alert. General Appearance-Consistent with stated age. Hydration-Well hydrated. Voice-Normal.  Head and Neck Head-normocephalic, atraumatic with no lesions or palpable masses.  Eye Eyeball - Bilateral-Extraocular movements intact. Sclera/Conjunctiva - Bilateral-No scleral icterus.  Chest and Lung Exam Chest and lung exam reveals -quiet, even and easy respiratory effort with no use of accessory muscles and on auscultation, normal breath sounds, no adventitious sounds and normal vocal resonance. Inspection Chest Wall - Normal. Back - normal.  Cardiovascular Cardiovascular examination reveals -on palpation PMI is normal in location and amplitude, no palpable S3 or S4. Normal cardiac borders., normal heart sounds, regular rate and rhythm with no murmurs, carotid auscultation reveals no bruits and normal pedal pulses bilaterally.  Abdomen Inspection Inspection of the abdomen reveals - No Hernias. Skin - Scar - no surgical scars. Palpation/Percussion Palpation and Percussion of the abdomen reveal - Soft, Non Tender, No Rebound tenderness, No Rigidity (guarding) and No hepatosplenomegaly. Auscultation Auscultation of the abdomen reveals - Bowel sounds normal.  Neurologic Neurologic evaluation reveals -alert and oriented x 3 with no impairment of recent or remote memory. Mental Status-Normal.  Musculoskeletal Normal Exam - Left-Upper Extremity Strength Normal and Lower Extremity Strength Normal. Normal Exam - Right-Upper Extremity Strength Normal, Lower Extremity Weakness.    Assessment & Plan (Katelyn Ruland A. Natalie Mceuen MD; 12/29/2014 4:09  PM)  SYMPTOMATIC CHOLELITHIASIS (574.20  K80.20) Impression: recommend laparoscopic cholecystectomy and cholangiogram The procedure has been discussed with the patient. Risks of laparoscopic cholecystectomy include bleeding, infection, bile duct injury, leak, death, open surgery, diarrhea, other surgery, organ injury, blood vessel injury, DVT, and additional care.  Current Plans Pt Education - Patient information: Gallstones (Beyond the Basics): discussed with patient and provided information. Pt Education - Patient information: Gallbladder removal (cholecystectomy) (The Basics): discussed with patient and provided information. Pt Education - [facility]

## 2014-12-30 ENCOUNTER — Other Ambulatory Visit: Payer: Self-pay | Admitting: Endocrinology

## 2015-06-22 ENCOUNTER — Other Ambulatory Visit: Payer: Self-pay | Admitting: Endocrinology

## 2015-12-07 ENCOUNTER — Other Ambulatory Visit: Payer: Self-pay | Admitting: Endocrinology

## 2016-03-07 ENCOUNTER — Other Ambulatory Visit: Payer: Self-pay | Admitting: Gastroenterology

## 2016-03-08 ENCOUNTER — Encounter (HOSPITAL_COMMUNITY): Payer: Self-pay | Admitting: *Deleted

## 2016-03-08 NOTE — Progress Notes (Signed)
Pt denies SOB, chest pain, and being under the care of a cardiologist. Pt denies having a cardiac cath but stated that Dr. Wynonia Lawman, cardiology, performed a stress test  as well as an echo ( not sure of date). Pt denies having an EKG and chest x ray within the last year. Pt verbalized understanding of all pre-op instructions.

## 2016-03-09 ENCOUNTER — Ambulatory Visit (HOSPITAL_COMMUNITY): Payer: 59 | Admitting: Certified Registered Nurse Anesthetist

## 2016-03-09 ENCOUNTER — Encounter (HOSPITAL_COMMUNITY)
Admission: RE | Disposition: A | Discharge status: Home / Self Care | Payer: Self-pay | Source: Ambulatory Visit | Attending: Gastroenterology

## 2016-03-09 ENCOUNTER — Ambulatory Visit (HOSPITAL_COMMUNITY)
Admission: RE | Admit: 2016-03-09 | Discharge: 2016-03-09 | Disposition: A | Discharge status: Home / Self Care | Payer: 59 | Source: Ambulatory Visit | Attending: Gastroenterology | Admitting: Gastroenterology

## 2016-03-09 ENCOUNTER — Encounter (HOSPITAL_COMMUNITY): Payer: Self-pay | Admitting: *Deleted

## 2016-03-09 DIAGNOSIS — K648 Other hemorrhoids: Secondary | ICD-10-CM | POA: Insufficient documentation

## 2016-03-09 DIAGNOSIS — Z1211 Encounter for screening for malignant neoplasm of colon: Secondary | ICD-10-CM | POA: Diagnosis not present

## 2016-03-09 DIAGNOSIS — K5900 Constipation, unspecified: Secondary | ICD-10-CM | POA: Diagnosis not present

## 2016-03-09 DIAGNOSIS — Z800 Family history of malignant neoplasm of digestive organs: Secondary | ICD-10-CM | POA: Diagnosis not present

## 2016-03-09 DIAGNOSIS — K644 Residual hemorrhoidal skin tags: Secondary | ICD-10-CM | POA: Diagnosis not present

## 2016-03-09 DIAGNOSIS — K5730 Diverticulosis of large intestine without perforation or abscess without bleeding: Secondary | ICD-10-CM | POA: Diagnosis not present

## 2016-03-09 DIAGNOSIS — Z87891 Personal history of nicotine dependence: Secondary | ICD-10-CM | POA: Diagnosis not present

## 2016-03-09 HISTORY — PX: COLONOSCOPY WITH PROPOFOL: SHX5780

## 2016-03-09 HISTORY — DX: Pneumonia, unspecified organism: J18.9

## 2016-03-09 SURGERY — COLONOSCOPY WITH PROPOFOL
Anesthesia: General

## 2016-03-09 MED ORDER — SODIUM CHLORIDE 0.9 % IV SOLN: INTRAVENOUS | Status: DC

## 2016-03-09 MED ORDER — PHENYLEPHRINE 40 MCG/ML (10ML) SYRINGE FOR IV PUSH (FOR BLOOD PRESSURE SUPPORT)
PREFILLED_SYRINGE | INTRAVENOUS | Status: DC | PRN
  Administered 2016-03-09: 80 ug via INTRAVENOUS

## 2016-03-09 MED ORDER — LIDOCAINE 2% (20 MG/ML) 5 ML SYRINGE
INTRAMUSCULAR | Status: DC | PRN
  Administered 2016-03-09: 20 mg via INTRAVENOUS

## 2016-03-09 MED ORDER — LACTATED RINGERS IV SOLN
INTRAVENOUS | Status: DC
  Administered 2016-03-09 (×2): via INTRAVENOUS

## 2016-03-09 MED ORDER — PROPOFOL 500 MG/50ML IV EMUL
INTRAVENOUS | Status: DC | PRN
  Administered 2016-03-09: 75 ug/kg/min via INTRAVENOUS

## 2016-03-09 NOTE — Anesthesia Procedure Notes (Signed)
Procedure Name: MAC Date/Time: 03/09/2016 11:51 AM Performed by: Everlean Cherry A Pre-anesthesia Checklist: Emergency Drugs available, Patient identified, Suction available and Patient being monitored Patient Re-evaluated:Patient Re-evaluated prior to inductionOxygen Delivery Method: Nasal cannula Intubation Type: IV induction

## 2016-03-09 NOTE — H&P (Signed)
Patient interval history reviewed.  Patient examined again.  There has been no change from documented H/P dated 03/07/16 (scanned into chart from our office) except as documented above.  Assessment:  1.  Constipation. 2.  Personal history perforated diverticulitis with surgical resection 20+ years ago. 3.  Family history of colonic cancer (mother).  Plan:  1.  Colonoscopy with ultraslim colonoscope. 2.  Risks (bleeding, infection, bowel perforation that could require surgery, sedation-related changes in cardiopulmonary systems), benefits (identification and possible treatment of source of symptoms, exclusion of certain causes of symptoms), and alternatives (watchful waiting, radiographic imaging studies, empiric medical treatment) of colonoscopy were explained to patient/family in detail and patient wishes to proceed.

## 2016-03-09 NOTE — Anesthesia Preprocedure Evaluation (Signed)
Anesthesia Evaluation  Patient identified by MRN, date of birth, ID band Patient awake    Reviewed: Allergy & Precautions, NPO status , Patient's Chart, lab work & pertinent test results  Airway Mallampati: II  TM Distance: >3 FB Neck ROM: Full    Dental   Pulmonary shortness of breath, pneumonia, former smoker,    breath sounds clear to auscultation       Cardiovascular negative cardio ROS   Rhythm:Regular Rate:Normal     Neuro/Psych    GI/Hepatic Neg liver ROS, GI history noted. CE   Endo/Other  negative endocrine ROSHypothyroidism   Renal/GU negative Renal ROS     Musculoskeletal   Abdominal   Peds  Hematology   Anesthesia Other Findings   Reproductive/Obstetrics                             Anesthesia Physical Anesthesia Plan  ASA: II  Anesthesia Plan: General   Post-op Pain Management:    Induction: Intravenous  Airway Management Planned: Simple Face Mask  Additional Equipment:   Intra-op Plan:   Post-operative Plan:   Informed Consent:   Dental advisory given  Plan Discussed with: CRNA and Anesthesiologist  Anesthesia Plan Comments:         Anesthesia Quick Evaluation

## 2016-03-09 NOTE — Anesthesia Preprocedure Evaluation (Deleted)
Anesthesia Evaluation  Patient identified by MRN, date of birth, ID band Patient awake    Airway Mallampati: I  TM Distance: >3 FB Neck ROM: Full    Dental  (+) Teeth Intact, Dental Advisory Given   Pulmonary former smoker,    breath sounds clear to auscultation       Cardiovascular negative cardio ROS  + Valvular Problems/Murmurs MVP  Rhythm:Regular Rate:Normal     Neuro/Psych negative neurological ROS     GI/Hepatic Neg liver ROS,   Endo/Other  Hypothyroidism   Renal/GU negative Renal ROS  negative genitourinary   Musculoskeletal  (+) Arthritis ,   Abdominal   Peds  Hematology   Anesthesia Other Findings   Reproductive/Obstetrics                             Anesthesia Physical Anesthesia Plan  ASA:   Anesthesia Plan: MAC   Post-op Pain Management:    Induction: Intravenous  Airway Management Planned: Nasal Cannula  Additional Equipment:   Intra-op Plan:   Post-operative Plan:   Informed Consent: I have reviewed the patients History and Physical, chart, labs and discussed the procedure including the risks, benefits and alternatives for the proposed anesthesia with the patient or authorized representative who has indicated his/her understanding and acceptance.   Dental advisory given  Plan Discussed with: CRNA and Surgeon  Anesthesia Plan Comments:         Anesthesia Quick Evaluation

## 2016-03-09 NOTE — Anesthesia Postprocedure Evaluation (Signed)
Anesthesia Post Note  Patient: Katelyn Anthony  Procedure(s) Performed: Procedure(s) (LRB): COLONOSCOPY WITH PROPOFOL (N/A)  Patient location during evaluation: Endoscopy Anesthesia Type: MAC Level of consciousness: awake Pain management: pain level controlled Vital Signs Assessment: post-procedure vital signs reviewed and stable Respiratory status: spontaneous breathing Cardiovascular status: stable Anesthetic complications: no    Last Vitals:  Vitals:   03/09/16 1230 03/09/16 1240  BP: 137/63 (!) 147/62  Pulse:    Resp:    Temp:      Last Pain: There were no vitals filed for this visit.               EDWARDS,Arionna Hoggard

## 2016-03-09 NOTE — Op Note (Signed)
[facility] Patient Name: Katelyn Anthony Procedure Date : 03/09/2016 MRN: [id number] Attending MD: Arta Silence , MD Date of Birth: 04-Feb-1946 CSN: [id number] Age: 70 Admit Type: Outpatient Procedure:                Colonoscopy Indications:              Last colonoscopy 5 years ago, Family history of                            colon cancer in a first-degree relative,                            Constipation, surgery 20 years ago for perforated                            diverticulitis. Providers:                Arta Silence, MD, Vista Lawman, RN, Corliss Parish, Technician Referring MD:             Darcus Austin, MD Medicines:                Monitored Anesthesia Care Complications:            No immediate complications. Estimated Blood Loss:     Estimated blood loss: none. Procedure:                Pre-Anesthesia Assessment:                           - Prior to the procedure, a History and Physical                            was performed, and patient medications and                            allergies were reviewed. The patient's tolerance of                            previous anesthesia was also reviewed. The risks                            and benefits of the procedure and the sedation                            options and risks were discussed with the patient.                            All questions were answered, and informed consent                            was obtained. Prior Anticoagulants: The patient has                            taken no  previous anticoagulant or antiplatelet                            agents. ASA Grade Assessment: II - A patient with                            mild systemic disease. After reviewing the risks                            and benefits, the patient was deemed in                            satisfactory condition to undergo the procedure.                           After obtaining informed consent,  the colonoscope                            was passed under direct vision. Throughout the                            procedure, the patient's blood pressure, pulse, and                            oxygen saturations were monitored continuously. The                            [id number] scope was introduced through                            the anus and advanced to the the ileocolonic                            anastomosis. The appendiceal orifice, ileocolonic                            anastomosis and neoterminal ileum were                            photographed. The colonoscopy was performed without                            difficulty. The patient tolerated the procedure                            well. The quality of the bowel preparation was                            adequate. Scope In: 11:55:00 AM Scope Out: 12:20:30 PM Scope Withdrawal Time: 0 hours 7 minutes 8 seconds  Total Procedure Duration: 0 hours 25 minutes 30 seconds  Findings:      The perianal exam findings include non-thrombosed external hemorrhoids.      Internal hemorrhoids were found during retroflexion. The hemorrhoids       were mild.  No additional abnormalities were found on retroflexion.      A few medium-mouthed diverticula were found in the sigmoid colon and       descending colon.      Patient has what appears to be right hemicolectomy with ileocolonic       anastomosis, with what appears to be residuual appendiceal stump. The       resideal cecal/appendiceal region looks normal and there is       normal-appearing ileocolonic anastomosis. Distal 5cm of the neoterminal       ileum also looks normal.      Colon otherwise normal; no other polyps, masses, vascular ectasias, or       inflammatory changes were seen. Impression:               - Non-thrombosed external hemorrhoids found on                            perianal exam.                           - Internal hemorrhoids.                            - Diverticulosis in the sigmoid colon and in the                            descending colon. Moderate Sedation:      None Recommendation:           - Discharge patient to home (via wheelchair).                           - High fiber diet indefinitely.                           - Continue present medications.                           - Repeat colonoscopy in 5 years for screening                            purposes, pending patient's clinical state of                            health at that time.                           - Return to GI clinic PRN.                           - Return to referring physician as previously                            scheduled. Procedure Code(s):        --- Professional ---                           [phone], Colonoscopy, flexible; diagnostic, including  collection of specimen(s) by brushing or washing,                            when performed (separate procedure) Diagnosis Code(s):        --- Professional ---                           K64.4, Residual hemorrhoidal skin tags                           K64.8, Other hemorrhoids                           Z80.0, Family history of malignant neoplasm of                            digestive organs                           K59.00, Constipation, unspecified                           K57.30, Diverticulosis of large intestine without                            perforation or abscess without bleeding CPT copyright 2016 [employer]. All rights reserved. The codes documented in this report are preliminary and upon coder review may  be revised to meet current compliance requirements. Arta Silence, MD 03/09/2016 12:35:12 PM This report has been signed electronically. Number of Addenda: 0

## 2016-03-09 NOTE — Transfer of Care (Signed)
Immediate Anesthesia Transfer of Care Note  Patient: Katelyn Anthony  Procedure(s) Performed: Procedure(s): COLONOSCOPY WITH PROPOFOL (N/A)  Patient Location: endoscopy unit  Anesthesia Type:MAC  Level of Consciousness: awake, alert , oriented and patient cooperative  Airway & Oxygen Therapy: Patient Spontanous Breathing and Patient connected to nasal cannula oxygen  Post-op Assessment: Report given to RN and Post -op Vital signs reviewed and stable  Post vital signs: Reviewed and stable  Last Vitals:  Vitals:   03/09/16 1004  BP: (!) 180/64  Pulse: 66  Resp: 14  Temp: 36.9 C    Last Pain: There were no vitals filed for this visit.       Complications: No apparent anesthesia complications

## 2016-03-09 NOTE — Discharge Instructions (Signed)

## 2016-03-10 ENCOUNTER — Encounter (HOSPITAL_COMMUNITY): Payer: Self-pay | Admitting: Gastroenterology

## 2016-05-05 ENCOUNTER — Emergency Department (HOSPITAL_BASED_OUTPATIENT_CLINIC_OR_DEPARTMENT_OTHER)
Admission: EM | Admit: 2016-05-05 | Discharge: 2016-05-06 | Disposition: A | Discharge status: Home / Self Care | Payer: 59 | Attending: Emergency Medicine | Admitting: Emergency Medicine

## 2016-05-05 ENCOUNTER — Emergency Department (HOSPITAL_BASED_OUTPATIENT_CLINIC_OR_DEPARTMENT_OTHER): Payer: 59

## 2016-05-05 ENCOUNTER — Encounter (HOSPITAL_BASED_OUTPATIENT_CLINIC_OR_DEPARTMENT_OTHER): Payer: Self-pay | Admitting: Emergency Medicine

## 2016-05-05 DIAGNOSIS — Z87891 Personal history of nicotine dependence: Secondary | ICD-10-CM | POA: Insufficient documentation

## 2016-05-05 DIAGNOSIS — R5383 Other fatigue: Secondary | ICD-10-CM | POA: Diagnosis present

## 2016-05-05 DIAGNOSIS — R531 Weakness: Secondary | ICD-10-CM | POA: Diagnosis not present

## 2016-05-05 LAB — BASIC METABOLIC PANEL
ANION GAP: 6 (ref 5–15)
BUN: 8 mg/dL (ref 6–20)
CALCIUM: 9.5 mg/dL (ref 8.9–10.3)
CO2: 29 mmol/L (ref 22–32)
CREATININE: 0.75 mg/dL (ref 0.44–1.00)
Chloride: 103 mmol/L (ref 101–111)
GFR calc Af Amer: >60 mL/min (ref >60)
GFR calc non Af Amer: >60 mL/min (ref >60)
GLUCOSE: 103 mg/dL — AB (ref 65–99)
Potassium: 4.4 mmol/L (ref 3.5–5.1)
Sodium: 138 mmol/L (ref 135–145)

## 2016-05-05 LAB — CBC WITH DIFFERENTIAL/PLATELET
Basophils Absolute: 0 10*3/uL (ref 0.0–0.1)
Basophils Relative: 0 %
Eosinophils Absolute: 0.2 10*3/uL (ref 0.0–0.7)
Eosinophils Relative: 3 %
HEMATOCRIT: 39.3 % (ref 36.0–46.0)
Hemoglobin: 13.6 g/dL (ref 12.0–15.0)
LYMPHS ABS: 1.3 10*3/uL (ref 0.7–4.0)
LYMPHS PCT: 19 %
MCH: 31.1 pg (ref 26.0–34.0)
MCHC: 34.6 g/dL (ref 30.0–36.0)
MCV: 89.9 fL (ref 78.0–100.0)
MONO ABS: 1 10*3/uL (ref 0.1–1.0)
MONOS PCT: 16 %
NEUTROS ABS: 4.1 10*3/uL (ref 1.7–7.7)
Neutrophils Relative %: 62 %
Platelets: 231 10*3/uL (ref 150–400)
RBC: 4.37 MIL/uL (ref 3.87–5.11)
RDW: 12.8 % (ref 11.5–15.5)
WBC: 6.6 10*3/uL (ref 4.0–10.5)

## 2016-05-05 LAB — URINALYSIS, ROUTINE W REFLEX MICROSCOPIC
Bilirubin Urine: NEGATIVE
GLUCOSE, UA: NEGATIVE mg/dL
Hgb urine dipstick: NEGATIVE
Ketones, ur: 15 mg/dL — AB
LEUKOCYTES UA: NEGATIVE
Nitrite: NEGATIVE
PH: 5 (ref 5.0–8.0)
Protein, ur: NEGATIVE mg/dL
Specific Gravity, Urine: 1.023 (ref 1.005–1.030)

## 2016-05-05 NOTE — ED Provider Notes (Signed)
[facility] Provider Note: Georgena Spurling, MD, FACEP  CSN: [id number] MRN: [id number] ARRIVAL: 05/05/16 at 2135   By signing my name below, I, Hansel Feinstein, attest that this documentation has been prepared under the direction and in the presence of Shanon Rosser, MD. Electronically Signed: Hansel Feinstein, ED Scribe. 05/05/16. 11:18 PM.    CHIEF COMPLAINT  Fatigue   HISTORY OF PRESENT ILLNESS  Katelyn Anthony is a 70 y.o. female with h/o thyroid disease who presents to the Emergency Department complaining of moderate, generalized fatigue for 3 days. She also complains of mild HA and difficulty falling asleep. Pt states her appetite has been decreased as well. Pt states she has had chills, but reports this is normal for her as a result of her thyroid disease. Pt reports her current symptoms feel similar to when she had Pneumonia 7 years ago. She has had the pneumonia vaccine this year. She states she has not missed any doses of Synthroid. She denies cough, chest pain, shortness of breath, fever, nausea, vomiting, diarrhea, dysuria and abdominal pain.   Past Medical History:  Diagnosis Date  . Allergy    feathers  . Cataract   . MVP (mitral valve prolapse)   . Osteopenia   . Pneumonia   . Thyroid disease     Past Surgical History:  Procedure Laterality Date  . ABDOMINAL HYSTERECTOMY    . APPENDECTOMY    . COLON SURGERY     diverticulitis  . COLONOSCOPY WITH PROPOFOL N/A 03/09/2016   Procedure: COLONOSCOPY WITH PROPOFOL;  Surgeon: Arta Silence, MD;  Location: [facility] ENDOSCOPY;  Service: Endoscopy;  Laterality: N/A;  . TONSILLECTOMY AND ADENOIDECTOMY      Family History  Problem Relation Age of Onset  . Colon cancer Mother     Social History  Substance Use Topics  . Smoking status: Former Smoker    Types: Cigarettes  . Smokeless tobacco: Never Used  . Alcohol use 1.2 oz/week    2 Glasses of wine per week     Comment: 1.5- 2 glasses of wine daily    Prior to  Admission medications   Medication Sig Start Date End Date Taking? Authorizing Provider  SYNTHROID 50 MCG tablet TAKE 1 TABLET BY MOUTH EVERY DAY AND 1/2 TABLET EXTRA TWICE A WEEK Patient taking differently: TAKE 1 TABLET BY MOUTH EVERY DAY AND 1/2 TABLET EXTRA every other day A WEEK 06/22/15   Elayne Snare, MD  VIVELLE-DOT 0.075 MG/24HR Place 1 patch onto the skin 2 (two) times a week.  10/12/10   Historical Provider, MD    Allergies Amoxicillin-pot clavulanate; Azithromycin; Levofloxacin; Naproxen sodium; and Sulfonamide derivatives   REVIEW OF SYSTEMS  Negative except as noted here or in the History of Present Illness.   PHYSICAL EXAMINATION  Initial Vital Signs Blood pressure 146/72, pulse 81, temperature 98.9 F (37.2 C), resp. rate 18, height 5\' 1"  (1.549 m), weight 94 lb (42.6 kg), SpO2 100 %.  Examination General: Well-developed, thin female in no acute distress; appearance consistent with age of record HENT: normocephalic; atraumatic; no thyromegaly  Eyes: pupils equal, round and reactive to light; extraocular muscles intact Neck: supple Heart: regular rate and rhythm Lungs: clear to auscultation bilaterally Abdomen: soft; nondistended; mild epigastric tenderness; no masses or hepatosplenomegaly; bowel sounds present  Extremities: No deformity; full range of motion; pulses normal Neurologic: Awake, alert and oriented; motor function intact in all extremities and symmetric; no facial droop Skin: Warm and dry Psychiatric: Normal mood and  affect   RESULTS  Summary of this visit's results, reviewed by myself:   EKG Interpretation  Date/Time:    Ventricular Rate:    PR Interval:    QRS Duration:   QT Interval:    QTC Calculation:   R Axis:     Text Interpretation:        Laboratory Studies: Results for orders placed or performed during the hospital encounter of 05/05/16 (from the past 24 hour(s))  CBC with Differential/Platelet     Status: None   Collection Time:  05/05/16 10:50 PM  Result Value Ref Range   WBC 6.6 4.0 - 10.5 K/uL   RBC 4.37 3.87 - 5.11 MIL/uL   Hemoglobin 13.6 12.0 - 15.0 g/dL   HCT 39.3 36.0 - 46.0 %   MCV 89.9 78.0 - 100.0 fL   MCH 31.1 26.0 - 34.0 pg   MCHC 34.6 30.0 - 36.0 g/dL   RDW 12.8 11.5 - 15.5 %   Platelets 231 150 - 400 K/uL   Neutrophils Relative % 62 %   Neutro Abs 4.1 1.7 - 7.7 K/uL   Lymphocytes Relative 19 %   Lymphs Abs 1.3 0.7 - 4.0 K/uL   Monocytes Relative 16 %   Monocytes Absolute 1.0 0.1 - 1.0 K/uL   Eosinophils Relative 3 %   Eosinophils Absolute 0.2 0.0 - 0.7 K/uL   Basophils Relative 0 %   Basophils Absolute 0.0 0.0 - 0.1 K/uL  Basic metabolic panel     Status: Abnormal   Collection Time: 05/05/16 10:50 PM  Result Value Ref Range   Sodium 138 135 - 145 mmol/L   Potassium 4.4 3.5 - 5.1 mmol/L   Chloride 103 101 - 111 mmol/L   CO2 29 22 - 32 mmol/L   Glucose, Bld 103 (H) 65 - 99 mg/dL   BUN 8 6 - 20 mg/dL   Creatinine, Ser 0.75 0.44 - 1.00 mg/dL   Calcium 9.5 8.9 - 10.3 mg/dL   GFR calc non Af Amer >60 >60 mL/min   GFR calc Af Amer >60 >60 mL/min   Anion gap 6 5 - 15  Urinalysis, Routine w reflex microscopic (not at [facility])     Status: Abnormal   Collection Time: 05/05/16 11:10 PM  Result Value Ref Range   Color, Urine AMBER (A) YELLOW   APPearance CLOUDY (A) CLEAR   Specific Gravity, Urine 1.023 1.005 - 1.030   pH 5.0 5.0 - 8.0   Glucose, UA NEGATIVE NEGATIVE mg/dL   Hgb urine dipstick NEGATIVE NEGATIVE   Bilirubin Urine NEGATIVE NEGATIVE   Ketones, ur 15 (A) NEGATIVE mg/dL   Protein, ur NEGATIVE NEGATIVE mg/dL   Nitrite NEGATIVE NEGATIVE   Leukocytes, UA NEGATIVE NEGATIVE   Imaging Studies: Dg Chest 2 View  Result Date: 05/05/2016 CLINICAL DATA:  Acute onset of fatigue.  Initial encounter. EXAM: CHEST  2 VIEW COMPARISON:  Chest radiograph performed 05/01/2009 FINDINGS: The lungs are well-aerated and clear. There is no evidence of focal opacification, pleural effusion or  pneumothorax. The heart is normal in size; the mediastinal contour is within normal limits. No acute osseous abnormalities are seen. IMPRESSION: No acute cardiopulmonary process seen. Electronically Signed   By: Garald Balding M.D.   On: 05/05/2016 22:40    ED COURSE  Nursing notes and initial vitals signs, including pulse oximetry, reviewed.  Vitals:   05/05/16 2139  BP: 146/72  Pulse: 81  Resp: 18  Temp: 98.9 F (37.2 C)  SpO2: 100%  Weight: 94 lb (42.6 kg)  Height: 5\' 1"  (1.549 m)   Patient advised to follow-up with her PCP to have her thyroid level checked. It may be in need of adjustment. She was advised to try Benadryl 25 milligrams daily at bedtime as needed for insomnia.  PROCEDURES    ED DIAGNOSES     ICD-9-CM ICD-10-CM   1. Generalized weakness 780.79 R53.1    I personally performed the services described in this documentation, which was scribed in my presence. The recorded information has been reviewed and is accurate.    Shanon Rosser, MD 05/06/16 [phone]

## 2016-05-05 NOTE — ED Triage Notes (Signed)
Pt in c/o generally not feeling well and fatigue x 3 days. States similar to the last time she had pneumonia, but has had vaccine this year. Pt alert, interactive, ambulatory in NAD.

## 2016-05-06 MED ORDER — DIPHENHYDRAMINE HCL 25 MG PO CAPS
25.0000 mg | ORAL_CAPSULE | Freq: Once | ORAL | Status: AC
  Administered 2016-05-06: 25 mg via ORAL
  Filled 2016-05-06: qty 1

## 2016-08-17 DIAGNOSIS — E559 Vitamin D deficiency, unspecified: Secondary | ICD-10-CM | POA: Diagnosis not present

## 2016-08-17 DIAGNOSIS — E785 Hyperlipidemia, unspecified: Secondary | ICD-10-CM | POA: Diagnosis not present

## 2016-08-17 DIAGNOSIS — Z5181 Encounter for therapeutic drug level monitoring: Secondary | ICD-10-CM | POA: Diagnosis not present

## 2016-08-17 DIAGNOSIS — E039 Hypothyroidism, unspecified: Secondary | ICD-10-CM | POA: Diagnosis not present

## 2016-08-17 DIAGNOSIS — Z0000 Encounter for general adult medical examination without abnormal findings: Secondary | ICD-10-CM | POA: Diagnosis not present

## 2016-08-22 DIAGNOSIS — Z23 Encounter for immunization: Secondary | ICD-10-CM | POA: Diagnosis not present

## 2016-08-22 DIAGNOSIS — Z0000 Encounter for general adult medical examination without abnormal findings: Secondary | ICD-10-CM | POA: Diagnosis not present

## 2016-08-29 ENCOUNTER — Other Ambulatory Visit: Payer: Self-pay | Admitting: Family Medicine

## 2016-08-29 DIAGNOSIS — I341 Nonrheumatic mitral (valve) prolapse: Secondary | ICD-10-CM

## 2016-09-21 ENCOUNTER — Ambulatory Visit (HOSPITAL_COMMUNITY): Payer: 59

## 2016-12-07 DIAGNOSIS — L84 Corns and callosities: Secondary | ICD-10-CM | POA: Insufficient documentation

## 2016-12-29 ENCOUNTER — Ambulatory Visit: Payer: Self-pay | Admitting: General Surgery

## 2016-12-29 DIAGNOSIS — K8020 Calculus of gallbladder without cholecystitis without obstruction: Secondary | ICD-10-CM | POA: Diagnosis not present

## 2016-12-29 NOTE — H&P (Signed)
Katelyn Anthony 12/29/2016 3:37 PM Location: [facility] Patient #: [id number] DOB: [birth year] Single / Language: [name] / Race: White Female  History of Present Illness Katelyn Hollingshead MD; 12/29/2016 5:54 PM) The patient is a 71 year old female.   Note:She presents back to our office today, after being seen 2 years ago for symptomatic cholelithiasis. Her symptoms occurred after fatty meals. She has changed her diet significantly and this has helped control her symptoms but she still has some intermittent episodes. She eats the wrong things. An ultrasound 2 years ago demonstrated tiny gallstones and sludge within the gallbladder. Common bile duct diameter was within normal limits. She saw Dr. Brantley Stage at that time and he discussed elective laparoscopic cholecystectomy with her but she decided not to proceed with it. She is now interested in having the surgery. She lives alone.  She reports having a partial colectomy and appendectomy in the past. She has also had a hysterectomy.  Allergies Mammie Lorenzo, LPN; 0/07/6551 7:48 PM) Sulfa 10 *OPHTHALMIC AGENTS* 12/29/2014  Medication History Mammie Lorenzo, LPN; 08/31/784 7:54 PM) Synthroid (75MCG Tablet, Oral) Active. Estrace (1MG  Tablet, Oral) Active. Medications Reconciled    Vitals Claiborne Billings Dockery LPN; 10/31/2008 0:71 PM) 12/29/2016 3:38 PM Weight: 87.8 lb Height: 61.5in Height was reported by patient. Body Surface Area: 1.34 m Body Mass Index: 16.32 kg/m  Temp.: 97.50F(Oral)  Pulse: 63 (Regular)  BP: 112/64 (Sitting, Left Arm, Standard)      Physical Exam Katelyn Hollingshead MD; 12/29/2016 5:54 PM)  The physical exam findings are as follows: Note:GENERAL APPEARANCE: Very thin female in NAD. Pleasant and cooperative.  EARS, NOSE, MOUTH THROAT: [place]/AT external ears: no lesions or deformities external nose: no lesions or deformities hearing: grossly normal lips: moist, no  deformities EYES external: conjunctiva, lids, sclerae normal pupils: equal, round glasses: yes  CV ascultation: RRR, no murmur extremity edema: no extremity varicosities: no  RESP auscultation: breath sounds equal and clear respiratory effort: normal   GASTROINTESTINAL abdomen: Soft, non-tender, non-distended, no masses liver and spleen: not enlarged. hernia: none present scar: Subumbilical scar; right lower quadrant transverse scar MUSCULOSKELETAL station and gait: normal digits/nails: no clubbing or cyanosis deformities: none instability: none  SKIN jaundice: none  NEUROLOGIC speech: normal  PSYCHIATRIC alertness and orientation: normal mood/affect/behavior: normal judgement and insight: normal    Assessment & Plan Katelyn Hollingshead MD; 12/29/2016 5:58 PM)  SYMPTOMATIC CHOLELITHIASIS (K80.20) Impression: She has been able to control her symptoms somewhat with diet but is interested in proceeding with laparoscopic cholecystectomy.  Plan: Laparoscopic possible open cholecystectomy. I have explained the procedure, risks, and aftercare of cholecystectomy. Risks include but are not limited to bleeding, infection, wound problems, anesthesia, diarrhea, bile leak, injury to common bile duct/liver/intestine. She seems to understand and agrees with the plan.  Jackolyn Confer, M.D.

## 2017-01-05 DIAGNOSIS — J019 Acute sinusitis, unspecified: Secondary | ICD-10-CM | POA: Diagnosis not present

## 2017-02-27 DIAGNOSIS — H5213 Myopia, bilateral: Secondary | ICD-10-CM | POA: Diagnosis not present

## 2017-02-27 DIAGNOSIS — H52203 Unspecified astigmatism, bilateral: Secondary | ICD-10-CM | POA: Diagnosis not present

## 2017-02-27 DIAGNOSIS — H524 Presbyopia: Secondary | ICD-10-CM | POA: Diagnosis not present

## 2017-02-27 DIAGNOSIS — H2513 Age-related nuclear cataract, bilateral: Secondary | ICD-10-CM | POA: Diagnosis not present

## 2017-03-28 DIAGNOSIS — N958 Other specified menopausal and perimenopausal disorders: Secondary | ICD-10-CM | POA: Diagnosis not present

## 2017-03-28 DIAGNOSIS — Z1231 Encounter for screening mammogram for malignant neoplasm of breast: Secondary | ICD-10-CM | POA: Diagnosis not present

## 2017-03-28 DIAGNOSIS — M816 Localized osteoporosis [Lequesne]: Secondary | ICD-10-CM | POA: Diagnosis not present

## 2017-03-29 ENCOUNTER — Other Ambulatory Visit: Payer: Self-pay | Admitting: Obstetrics & Gynecology

## 2017-03-29 DIAGNOSIS — R928 Other abnormal and inconclusive findings on diagnostic imaging of breast: Secondary | ICD-10-CM

## 2017-04-06 ENCOUNTER — Ambulatory Visit
Admission: RE | Admit: 2017-04-06 | Discharge: 2017-04-06 | Disposition: A | Discharge status: Home / Self Care | Payer: PPO | Source: Ambulatory Visit | Attending: Obstetrics & Gynecology | Admitting: Obstetrics & Gynecology

## 2017-04-06 ENCOUNTER — Other Ambulatory Visit: Payer: Self-pay | Admitting: Obstetrics & Gynecology

## 2017-04-06 DIAGNOSIS — R928 Other abnormal and inconclusive findings on diagnostic imaging of breast: Secondary | ICD-10-CM

## 2017-04-06 DIAGNOSIS — R922 Inconclusive mammogram: Secondary | ICD-10-CM | POA: Diagnosis not present

## 2017-04-19 ENCOUNTER — Other Ambulatory Visit: Payer: Self-pay | Admitting: Obstetrics & Gynecology

## 2017-04-19 DIAGNOSIS — R928 Other abnormal and inconclusive findings on diagnostic imaging of breast: Secondary | ICD-10-CM

## 2017-05-04 DIAGNOSIS — N951 Menopausal and female climacteric states: Secondary | ICD-10-CM | POA: Diagnosis not present

## 2017-05-08 ENCOUNTER — Other Ambulatory Visit: Payer: Self-pay | Admitting: Obstetrics & Gynecology

## 2017-05-08 DIAGNOSIS — R928 Other abnormal and inconclusive findings on diagnostic imaging of breast: Secondary | ICD-10-CM

## 2017-05-10 DIAGNOSIS — H524 Presbyopia: Secondary | ICD-10-CM | POA: Insufficient documentation

## 2017-05-10 DIAGNOSIS — H5213 Myopia, bilateral: Secondary | ICD-10-CM | POA: Insufficient documentation

## 2017-05-17 ENCOUNTER — Ambulatory Visit
Admission: RE | Admit: 2017-05-17 | Discharge: 2017-05-17 | Disposition: A | Discharge status: Home / Self Care | Payer: PPO | Source: Ambulatory Visit | Attending: Obstetrics & Gynecology | Admitting: Obstetrics & Gynecology

## 2017-05-17 DIAGNOSIS — R928 Other abnormal and inconclusive findings on diagnostic imaging of breast: Secondary | ICD-10-CM

## 2017-05-17 DIAGNOSIS — R921 Mammographic calcification found on diagnostic imaging of breast: Secondary | ICD-10-CM | POA: Diagnosis not present

## 2017-05-17 DIAGNOSIS — D242 Benign neoplasm of left breast: Secondary | ICD-10-CM | POA: Diagnosis not present

## 2017-05-17 HISTORY — PX: BREAST BIOPSY: SHX20

## 2017-07-05 DIAGNOSIS — N760 Acute vaginitis: Secondary | ICD-10-CM | POA: Diagnosis not present

## 2017-10-20 ENCOUNTER — Ambulatory Visit: Payer: Self-pay | Admitting: General Surgery

## 2017-11-13 DIAGNOSIS — E039 Hypothyroidism, unspecified: Secondary | ICD-10-CM | POA: Diagnosis not present

## 2017-11-13 DIAGNOSIS — E559 Vitamin D deficiency, unspecified: Secondary | ICD-10-CM | POA: Diagnosis not present

## 2017-11-13 DIAGNOSIS — H6981 Other specified disorders of Eustachian tube, right ear: Secondary | ICD-10-CM | POA: Diagnosis not present

## 2017-12-28 NOTE — Pre-Procedure Instructions (Signed)
Katelyn Anthony  12/28/2017      [id number] [name], [place] [address] Phone: [phone] Fax: [phone]    Your procedure is scheduled on Wed., January 10, 2018 from 8:30AM-10:00AM  Report to [facility] Admitting Entrance "A" at 6:30AM  Call this number if you have problems the morning of surgery:  [phone]   Remember:  No food after midnight on June 18th  You may drink Clear Liquids until 3 hours (5:30AM) prior to surgery .  Clear liquids allowed are: Water, Juice (non-citric and without pulp), Carbonated beverages, Clear Tea, Black Coffee only, Plain Jell-O only, Gatorade and Plain Popsicles only. No Milk or milk products. Please complete your PRE-SURGERY ENSURE that was given to by 5:30AM the morning of surgery.  Please, if able, drink it in one setting. DO NOT SIP.   Take these medicines the morning of surgery with A SIP OF WATER By 5:30AM: None  7 days before surgery (6/12), stop taking all Other Aspirin Products, Vitamins, Fish oils, and Herbal medications. Also stop all NSAIDS i.e. Advil, Ibuprofen, Motrin, Aleve, Anaprox, Naproxen, BC, Goody Powders, and all Supplements.   Do not wear jewelry, make-up or nail polish (fingers).  Do not wear lotions, powders, or perfumes, or deodorant.  Do not shave 48 hours prior to surgery.    Do not bring valuables to the hospital.  [facility] is not responsible for any belongings or valuables.  Contacts, dentures or bridgework may not be worn into surgery.  Leave your suitcase in the car.  After surgery it may be brought to your room.  For patients admitted to the hospital, discharge time will be determined by your treatment team.  Patients discharged the day of surgery will not be allowed to drive home.   Special instructions:   [facility]- Preparing For Surgery  Before surgery, you can play an important role. Because skin is not sterile, your skin  needs to be as free of germs as possible. You can reduce the number of germs on your skin by washing with CHG (chlorahexidine gluconate) Soap before surgery.  CHG is an antiseptic cleaner which kills germs and bonds with the skin to continue killing germs even after washing.    Oral Hygiene is also important to reduce your risk of infection.  Remember - BRUSH YOUR TEETH THE MORNING OF SURGERY WITH YOUR REGULAR TOOTHPASTE  Please do not use if you have an allergy to CHG or antibacterial soaps. If your skin becomes reddened/irritated stop using the CHG.  Do not shave (including legs and underarms) for at least 48 hours prior to first CHG shower. It is OK to shave your face.  Please follow these instructions carefully.   1. Shower the NIGHT BEFORE SURGERY and the MORNING OF SURGERY with CHG.   2. If you chose to wash your hair, wash your hair first as usual with your normal shampoo.  3. After you shampoo, rinse your hair and body thoroughly to remove the shampoo.  4. Use CHG as you would any other liquid soap. You can apply CHG directly to the skin and wash gently with a scrungie or a clean washcloth.   5. Apply the CHG Soap to your body ONLY FROM THE NECK DOWN.  Do not use on open wounds or open sores. Avoid contact with your eyes, ears, mouth and genitals (private parts). Wash Face and genitals (private parts)  with  your normal soap.  6. Wash thoroughly, paying special attention to the area where your surgery will be performed.  7. Thoroughly rinse your body with warm water from the neck down.  8. DO NOT shower/wash with your normal soap after using and rinsing off the CHG Soap.  9. Pat yourself dry with a CLEAN TOWEL.  10. Wear CLEAN PAJAMAS to bed the night before surgery, wear comfortable clothes the morning of surgery  11. Place CLEAN SHEETS on your bed the night of your first shower and DO NOT SLEEP WITH PETS.  Day of Surgery:  Do not apply any deodorants/lotions.  Please wear  clean clothes to the hospital/surgery center.   Remember to brush your teeth WITH YOUR REGULAR TOOTHPASTE.  Please read over the following fact sheets that you were given. Pain Booklet, Coughing and Deep Breathing and Surgical Site Infection Prevention

## 2017-12-29 ENCOUNTER — Other Ambulatory Visit: Payer: Self-pay

## 2017-12-29 ENCOUNTER — Encounter (HOSPITAL_COMMUNITY)
Admission: RE | Admit: 2017-12-29 | Discharge: 2017-12-29 | Disposition: A | Discharge status: Home / Self Care | Payer: Medicare PPO | Source: Ambulatory Visit | Attending: General Surgery | Admitting: General Surgery

## 2017-12-29 ENCOUNTER — Encounter (HOSPITAL_COMMUNITY): Payer: Self-pay

## 2017-12-29 DIAGNOSIS — Z01810 Encounter for preprocedural cardiovascular examination: Secondary | ICD-10-CM | POA: Diagnosis not present

## 2017-12-29 DIAGNOSIS — K8080 Other cholelithiasis without obstruction: Secondary | ICD-10-CM | POA: Insufficient documentation

## 2017-12-29 DIAGNOSIS — I498 Other specified cardiac arrhythmias: Secondary | ICD-10-CM | POA: Diagnosis not present

## 2017-12-29 DIAGNOSIS — Z01812 Encounter for preprocedural laboratory examination: Secondary | ICD-10-CM | POA: Insufficient documentation

## 2017-12-29 HISTORY — DX: Hypothyroidism, unspecified: E03.9

## 2017-12-29 HISTORY — DX: Calculus of gallbladder with chronic cholecystitis without obstruction: K80.10

## 2017-12-29 LAB — CBC
HEMATOCRIT: 40.9 % (ref 36.0–46.0)
HEMOGLOBIN: 13.6 g/dL (ref 12.0–15.0)
MCH: 30.2 pg (ref 26.0–34.0)
MCHC: 33.3 g/dL (ref 30.0–36.0)
MCV: 90.9 fL (ref 78.0–100.0)
Platelets: 285 10*3/uL (ref 150–400)
RBC: 4.5 MIL/uL (ref 3.87–5.11)
RDW: 12.5 % (ref 11.5–15.5)
WBC: 5.5 10*3/uL (ref 4.0–10.5)

## 2017-12-29 LAB — COMPREHENSIVE METABOLIC PANEL
ALK PHOS: 70 U/L (ref 38–126)
ALT: 18 U/L (ref 14–54)
ANION GAP: 5 (ref 5–15)
AST: 28 U/L (ref 15–41)
Albumin: 4 g/dL (ref 3.5–5.0)
BILIRUBIN TOTAL: 0.6 mg/dL (ref 0.3–1.2)
BUN: 9 mg/dL (ref 6–20)
CALCIUM: 9.5 mg/dL (ref 8.9–10.3)
CO2: 28 mmol/L (ref 22–32)
Chloride: 108 mmol/L (ref 101–111)
Creatinine, Ser: 0.69 mg/dL (ref 0.44–1.00)
GFR calc Af Amer: >60 mL/min (ref >60)
Glucose, Bld: 102 mg/dL — ABNORMAL HIGH (ref 65–99)
POTASSIUM: 4.3 mmol/L (ref 3.5–5.1)
Sodium: 141 mmol/L (ref 135–145)
TOTAL PROTEIN: 6.6 g/dL (ref 6.5–8.1)

## 2017-12-29 NOTE — Progress Notes (Signed)
[facility]  Cardiologist - Denies  Chest x-ray - Denies  EKG - 12/29/17  Stress Test - 96- negative  ECHO - 96-Negative  Cardiac Cath - Denies  Sleep Study - Denies CPAP - None  LABS- 12/29/17: CBC, CMP  ASA- Denies  Anesthesia- No  Pt denies having chest pain, sob, or fever at this time. All instructions explained to the pt, with a verbal understanding of the material. Pt agrees to go over the instructions while at home for a better understanding. The opportunity to ask questions was provided.

## 2018-01-02 NOTE — Progress Notes (Signed)
Anesthesia Chart Review:  Case:  [id number] Date/Time:  01/10/18 0815   Procedure:  LAPAROSCOPIC CHOLECYSTECTOMY WITH INTRAOPERATIVE CHOLANGIOGRAM (N/A )   Anesthesia type:  General   Pre-op diagnosis:  Gallstones   Location:  [facility] OR ROOM 02 / [facility]   Surgeon:  Excell Seltzer, MD      DISCUSSION: Patient is a 72 year old female scheduled for the above procedure.  History includes former smoker, MVP, hypothyroidism. She reports prior echo '96. She denied any known symptoms from MVP.  Based on currently available information I would anticipate she could proceed as planned if no acute changes.  VS: BP (!) 157/69   Pulse 69   Temp 36.6 C (Oral)   Resp 20   Ht 5' 1.5" (1.562 m)   Wt 91 lb 6.4 oz (41.5 kg)   SpO2 99%   BMI 16.99 kg/m   PROVIDERS: Maurice Small, MD is PCP. She is not followed routinely by cardiology, but saw Dr. Viona Gilmore. Tollie Eth in the distant past (history stress, echo '96).  She has not followed routinely by pulmonary, but saw Dr. Baltazar Apo in 2010 for dyspnea following pneumonia.  Alpha-1 antitrypsin result within normal range.  LABS: Labs reviewed: Acceptable for surgery. (all labs ordered are listed, but only abnormal results are displayed)  Labs Reviewed  COMPREHENSIVE METABOLIC PANEL - Abnormal; Notable for the following components:      Result Value   Glucose, Bld 102 (*)    All other components within normal limits  CBC    EKG: 12/29/17 (done due to MVP history): NSR with sinus arrhythmia   CV: Remote stress and echo (~ 1996), reported history of MVP but otherwise "negative" results.   Past Medical History:  Diagnosis Date  . Allergy    feathers  . Cataract   . Cholelithiasis with cholecystitis    Chronic  . Hypothyroidism   . MVP (mitral valve prolapse)   . Osteopenia   . Pneumonia   . Thyroid disease     Past Surgical History:  Procedure Laterality Date  . ABDOMINAL HYSTERECTOMY    . APPENDECTOMY    . COLON SURGERY     diverticulitis   . COLONOSCOPY WITH PROPOFOL N/A 03/09/2016   Procedure: COLONOSCOPY WITH PROPOFOL;  Surgeon: Arta Silence, MD;  Location: [facility] ENDOSCOPY;  Service: Endoscopy;  Laterality: N/A;  . TONSILLECTOMY AND ADENOIDECTOMY      MEDICATIONS: . amoxicillin (AMOXIL) 500 MG tablet  . Cholecalciferol (VITAMIN D3) 5000 units CAPS  . levothyroxine (SYNTHROID, LEVOTHROID) 75 MCG tablet  . magic mouthwash SOLN  . senna (SENOKOT) 8.6 MG tablet  . SYNTHROID 50 MCG tablet  . VIVELLE-DOT 0.075 MG/24HR   No current facility-administered medications for this encounter.     [facility]/Anesthesiology Phone [phone] 01/02/2018 4:17 PM

## 2018-01-10 ENCOUNTER — Ambulatory Visit (HOSPITAL_COMMUNITY): Payer: Medicare PPO

## 2018-01-10 ENCOUNTER — Other Ambulatory Visit: Payer: Self-pay

## 2018-01-10 ENCOUNTER — Encounter (HOSPITAL_COMMUNITY): Payer: Self-pay | Admitting: *Deleted

## 2018-01-10 ENCOUNTER — Encounter (HOSPITAL_COMMUNITY)
Admission: RE | Disposition: A | Discharge status: Home / Self Care | Payer: Self-pay | Source: Ambulatory Visit | Attending: General Surgery

## 2018-01-10 ENCOUNTER — Ambulatory Visit (HOSPITAL_COMMUNITY): Payer: Medicare PPO | Admitting: Vascular Surgery

## 2018-01-10 ENCOUNTER — Observation Stay (HOSPITAL_COMMUNITY)
Admission: RE | Admit: 2018-01-10 | Discharge: 2018-01-11 | Disposition: A | Discharge status: Home / Self Care | Payer: Medicare PPO | Source: Ambulatory Visit | Attending: General Surgery | Admitting: General Surgery

## 2018-01-10 ENCOUNTER — Ambulatory Visit (HOSPITAL_COMMUNITY): Payer: Medicare PPO | Admitting: Anesthesiology

## 2018-01-10 DIAGNOSIS — Z880 Allergy status to penicillin: Secondary | ICD-10-CM | POA: Insufficient documentation

## 2018-01-10 DIAGNOSIS — I341 Nonrheumatic mitral (valve) prolapse: Secondary | ICD-10-CM | POA: Diagnosis not present

## 2018-01-10 DIAGNOSIS — K8010 Calculus of gallbladder with chronic cholecystitis without obstruction: Principal | ICD-10-CM | POA: Insufficient documentation

## 2018-01-10 DIAGNOSIS — Z882 Allergy status to sulfonamides status: Secondary | ICD-10-CM | POA: Insufficient documentation

## 2018-01-10 DIAGNOSIS — M8580 Other specified disorders of bone density and structure, unspecified site: Secondary | ICD-10-CM | POA: Insufficient documentation

## 2018-01-10 DIAGNOSIS — K8020 Calculus of gallbladder without cholecystitis without obstruction: Secondary | ICD-10-CM | POA: Diagnosis not present

## 2018-01-10 DIAGNOSIS — E039 Hypothyroidism, unspecified: Secondary | ICD-10-CM | POA: Insufficient documentation

## 2018-01-10 DIAGNOSIS — Z881 Allergy status to other antibiotic agents status: Secondary | ICD-10-CM | POA: Insufficient documentation

## 2018-01-10 DIAGNOSIS — Z79899 Other long term (current) drug therapy: Secondary | ICD-10-CM | POA: Insufficient documentation

## 2018-01-10 DIAGNOSIS — Z419 Encounter for procedure for purposes other than remedying health state, unspecified: Secondary | ICD-10-CM

## 2018-01-10 HISTORY — PX: CHOLECYSTECTOMY: SHX55

## 2018-01-10 SURGERY — LAPAROSCOPIC CHOLECYSTECTOMY WITH INTRAOPERATIVE CHOLANGIOGRAM
Anesthesia: General

## 2018-01-10 MED ORDER — ONDANSETRON HCL 4 MG/2ML IJ SOLN
INTRAMUSCULAR | Status: DC | PRN
  Administered 2018-01-10: 4 mg via INTRAVENOUS

## 2018-01-10 MED ORDER — ROCURONIUM BROMIDE 50 MG/5ML IV SOLN
INTRAVENOUS | Status: AC
  Filled 2018-01-10: qty 1

## 2018-01-10 MED ORDER — OXYCODONE HCL 5 MG PO TABS
5.0000 mg | ORAL_TABLET | ORAL | Status: DC | PRN
  Filled 2018-01-10: qty 1

## 2018-01-10 MED ORDER — LIDOCAINE 2% (20 MG/ML) 5 ML SYRINGE
INTRAMUSCULAR | Status: DC | PRN
  Administered 2018-01-10 (×2): 50 mg via INTRAVENOUS

## 2018-01-10 MED ORDER — BUPIVACAINE-EPINEPHRINE (PF) 0.5% -1:200000 IJ SOLN
INTRAMUSCULAR | Status: AC
  Filled 2018-01-10: qty 30

## 2018-01-10 MED ORDER — SUGAMMADEX SODIUM 200 MG/2ML IV SOLN
INTRAVENOUS | Status: AC
  Filled 2018-01-10: qty 2

## 2018-01-10 MED ORDER — ONDANSETRON HCL 4 MG/2ML IJ SOLN: 4.0000 mg | Freq: Four times a day (QID) | INTRAMUSCULAR | Status: DC | PRN

## 2018-01-10 MED ORDER — FENTANYL CITRATE (PF) 100 MCG/2ML IJ SOLN
INTRAMUSCULAR | Status: DC | PRN
  Administered 2018-01-10 (×2): 50 ug via INTRAVENOUS

## 2018-01-10 MED ORDER — DEXAMETHASONE SODIUM PHOSPHATE 10 MG/ML IJ SOLN
INTRAMUSCULAR | Status: AC
Start: 2018-01-10 — End: ?
  Filled 2018-01-10: qty 1

## 2018-01-10 MED ORDER — ONDANSETRON HCL 4 MG/2ML IJ SOLN
INTRAMUSCULAR | Status: AC
  Filled 2018-01-10: qty 2

## 2018-01-10 MED ORDER — SUCCINYLCHOLINE CHLORIDE 20 MG/ML IJ SOLN
INTRAMUSCULAR | Status: DC | PRN
  Administered 2018-01-10: 100 mg via INTRAVENOUS

## 2018-01-10 MED ORDER — FENTANYL CITRATE (PF) 100 MCG/2ML IJ SOLN
INTRAMUSCULAR | Status: AC
  Administered 2018-01-10: 12:00:00
  Filled 2018-01-10: qty 2

## 2018-01-10 MED ORDER — IOPAMIDOL (ISOVUE-300) INJECTION 61%
INTRAVENOUS | Status: AC
  Filled 2018-01-10: qty 50

## 2018-01-10 MED ORDER — MEPERIDINE HCL 50 MG/ML IJ SOLN: 6.2500 mg | INTRAMUSCULAR | Status: DC | PRN

## 2018-01-10 MED ORDER — SODIUM CHLORIDE 0.9 % IV SOLN
1.0000 g | INTRAVENOUS | Status: AC
  Administered 2018-01-10: 1 g via INTRAVENOUS
  Filled 2018-01-10: qty 1

## 2018-01-10 MED ORDER — ROCURONIUM BROMIDE 100 MG/10ML IV SOLN
INTRAVENOUS | Status: DC | PRN
  Administered 2018-01-10: 10 mg via INTRAVENOUS

## 2018-01-10 MED ORDER — GABAPENTIN 300 MG PO CAPS
300.0000 mg | ORAL_CAPSULE | ORAL | Status: AC
  Administered 2018-01-10: 300 mg via ORAL
  Filled 2018-01-10: qty 1

## 2018-01-10 MED ORDER — PROPOFOL 10 MG/ML IV BOLUS
INTRAVENOUS | Status: AC
  Filled 2018-01-10: qty 40

## 2018-01-10 MED ORDER — DEXAMETHASONE SODIUM PHOSPHATE 4 MG/ML IJ SOLN
INTRAMUSCULAR | Status: DC | PRN
  Administered 2018-01-10: 6 mg via INTRAVENOUS

## 2018-01-10 MED ORDER — SUCCINYLCHOLINE CHLORIDE 200 MG/10ML IV SOSY
PREFILLED_SYRINGE | INTRAVENOUS | Status: AC
Start: 2018-01-10 — End: ?
  Filled 2018-01-10: qty 10

## 2018-01-10 MED ORDER — 0.9 % SODIUM CHLORIDE (POUR BTL) OPTIME
TOPICAL | Status: DC | PRN
  Administered 2018-01-10: 1000 mL

## 2018-01-10 MED ORDER — ENOXAPARIN SODIUM 40 MG/0.4ML ~~LOC~~ SOLN
40.0000 mg | SUBCUTANEOUS | Status: DC
  Administered 2018-01-11: 40 mg via SUBCUTANEOUS
  Filled 2018-01-10: qty 0.4

## 2018-01-10 MED ORDER — FENTANYL CITRATE (PF) 250 MCG/5ML IJ SOLN
INTRAMUSCULAR | Status: AC
  Filled 2018-01-10: qty 5

## 2018-01-10 MED ORDER — LEVOTHYROXINE SODIUM 75 MCG PO TABS
75.0000 ug | ORAL_TABLET | Freq: Every day | ORAL | Status: DC
  Administered 2018-01-11: 75 ug via ORAL
  Filled 2018-01-10: qty 1

## 2018-01-10 MED ORDER — ACETAMINOPHEN 500 MG PO TABS
1000.0000 mg | ORAL_TABLET | ORAL | Status: AC
  Administered 2018-01-10: 1000 mg via ORAL
  Filled 2018-01-10: qty 2

## 2018-01-10 MED ORDER — ONDANSETRON 4 MG PO TBDP: 4.0000 mg | ORAL_TABLET | Freq: Four times a day (QID) | ORAL | Status: DC | PRN

## 2018-01-10 MED ORDER — FENTANYL CITRATE (PF) 100 MCG/2ML IJ SOLN
25.0000 ug | INTRAMUSCULAR | Status: DC | PRN
  Administered 2018-01-10: 25 ug via INTRAVENOUS

## 2018-01-10 MED ORDER — LACTATED RINGERS IV SOLN
INTRAVENOUS | Status: DC | PRN
  Administered 2018-01-10: 08:00:00 via INTRAVENOUS

## 2018-01-10 MED ORDER — GABAPENTIN 300 MG PO CAPS
300.0000 mg | ORAL_CAPSULE | Freq: Two times a day (BID) | ORAL | Status: DC
Start: 2018-01-10 — End: 2018-01-11
  Administered 2018-01-10 – 2018-01-11 (×3): 300 mg via ORAL
  Filled 2018-01-10 (×3): qty 1

## 2018-01-10 MED ORDER — BUPIVACAINE-EPINEPHRINE 0.5% -1:200000 IJ SOLN
INTRAMUSCULAR | Status: DC | PRN
  Administered 2018-01-10: 8 mL

## 2018-01-10 MED ORDER — DEXTROSE IN LACTATED RINGERS 5 % IV SOLN
INTRAVENOUS | Status: DC
  Administered 2018-01-10: 12:00:00 via INTRAVENOUS

## 2018-01-10 MED ORDER — MORPHINE SULFATE (PF) 2 MG/ML IV SOLN: 2.0000 mg | INTRAVENOUS | Status: DC | PRN

## 2018-01-10 MED ORDER — SODIUM CHLORIDE 0.9 % IV SOLN
INTRAVENOUS | Status: DC | PRN
  Administered 2018-01-10: 5 mL

## 2018-01-10 MED ORDER — SODIUM CHLORIDE 0.9 % IR SOLN
Status: DC | PRN
  Administered 2018-01-10: 1000 mL

## 2018-01-10 MED ORDER — METOCLOPRAMIDE HCL 5 MG/ML IJ SOLN: 10.0000 mg | Freq: Once | INTRAMUSCULAR | Status: DC | PRN

## 2018-01-10 MED ORDER — SENNA 8.6 MG PO TABS
1.0000 | ORAL_TABLET | Freq: Every day | ORAL | Status: DC | PRN
  Filled 2018-01-10 (×2): qty 1

## 2018-01-10 MED ORDER — CHLORHEXIDINE GLUCONATE CLOTH 2 % EX PADS: 6.0000 | MEDICATED_PAD | Freq: Once | CUTANEOUS | Status: DC

## 2018-01-10 MED ORDER — SUGAMMADEX SODIUM 200 MG/2ML IV SOLN
INTRAVENOUS | Status: DC | PRN
  Administered 2018-01-10: 100 mg via INTRAVENOUS

## 2018-01-10 MED ORDER — LIDOCAINE 2% (20 MG/ML) 5 ML SYRINGE
INTRAMUSCULAR | Status: AC
  Filled 2018-01-10: qty 5

## 2018-01-10 MED ORDER — PROPOFOL 10 MG/ML IV BOLUS
INTRAVENOUS | Status: DC | PRN
  Administered 2018-01-10: 30 mg via INTRAVENOUS
  Administered 2018-01-10: 100 mg via INTRAVENOUS

## 2018-01-10 MED ORDER — PHENYLEPHRINE HCL 10 MG/ML IJ SOLN
INTRAVENOUS | Status: DC | PRN
  Administered 2018-01-10: 50 ug/min via INTRAVENOUS

## 2018-01-10 MED ORDER — ACETAMINOPHEN 500 MG PO TABS
1000.0000 mg | ORAL_TABLET | Freq: Four times a day (QID) | ORAL | Status: DC
  Administered 2018-01-10 – 2018-01-11 (×4): 1000 mg via ORAL
  Filled 2018-01-10 (×4): qty 2

## 2018-01-10 SURGICAL SUPPLY — 44 items
ADH SKN CLS APL DERMABOND .7 (GAUZE/BANDAGES/DRESSINGS) ×1
ADH SKN CLS LQ APL DERMABOND (GAUZE/BANDAGES/DRESSINGS) ×1
APPLIER CLIP ROT 10 11.4 M/L (STAPLE) ×3
APR CLP MED LRG 11.4X10 (STAPLE) ×1
BAG SPEC RTRVL LRG 6X4 10 (ENDOMECHANICALS) ×1
BLADE CLIPPER SURG (BLADE) IMPLANT
CANISTER SUCT 3000ML PPV (MISCELLANEOUS) ×3 IMPLANT
CHLORAPREP W/TINT 26ML (MISCELLANEOUS) ×3 IMPLANT
CLIP APPLIE ROT 10 11.4 M/L (STAPLE) ×1 IMPLANT
COVER MAYO STAND STRL (DRAPES) ×3 IMPLANT
COVER SURGICAL LIGHT HANDLE (MISCELLANEOUS) ×3 IMPLANT
DERMABOND ADHESIVE PROPEN (GAUZE/BANDAGES/DRESSINGS) ×2
DERMABOND ADVANCED (GAUZE/BANDAGES/DRESSINGS) ×2
DERMABOND ADVANCED .7 DNX12 (GAUZE/BANDAGES/DRESSINGS) ×1 IMPLANT
DERMABOND ADVANCED .7 DNX6 (GAUZE/BANDAGES/DRESSINGS) IMPLANT
DRAPE C-ARM 42X72 X-RAY (DRAPES) ×3 IMPLANT
ELECT REM PT RETURN 9FT ADLT (ELECTROSURGICAL) ×3
ELECTRODE REM PT RTRN 9FT ADLT (ELECTROSURGICAL) ×1 IMPLANT
GLOVE BIOGEL PI IND STRL 8 (GLOVE) ×1 IMPLANT
GLOVE BIOGEL PI INDICATOR 8 (GLOVE) ×2
GLOVE ECLIPSE 7.5 STRL STRAW (GLOVE) ×3 IMPLANT
GOWN STRL REUS W/ TWL LRG LVL3 (GOWN DISPOSABLE) ×2 IMPLANT
GOWN STRL REUS W/ TWL XL LVL3 (GOWN DISPOSABLE) ×1 IMPLANT
GOWN STRL REUS W/TWL LRG LVL3 (GOWN DISPOSABLE) ×6
GOWN STRL REUS W/TWL XL LVL3 (GOWN DISPOSABLE) ×3
KIT BASIN OR (CUSTOM PROCEDURE TRAY) ×3 IMPLANT
KIT TURNOVER KIT B (KITS) ×3 IMPLANT
NS IRRIG 1000ML POUR BTL (IV SOLUTION) ×3 IMPLANT
PAD ARMBOARD 7.5X6 YLW CONV (MISCELLANEOUS) ×3 IMPLANT
POUCH SPECIMEN RETRIEVAL 10MM (ENDOMECHANICALS) ×3 IMPLANT
SCISSORS LAP 5X35 DISP (ENDOMECHANICALS) ×3 IMPLANT
SET CHOLANGIOGRAPH 5 50 .035 (SET/KITS/TRAYS/PACK) ×3 IMPLANT
SET IRRIG TUBING LAPAROSCOPIC (IRRIGATION / IRRIGATOR) ×3 IMPLANT
SLEEVE ENDOPATH XCEL 5M (ENDOMECHANICALS) ×3 IMPLANT
SPECIMEN JAR SMALL (MISCELLANEOUS) ×3 IMPLANT
SUT MON AB 5-0 PS2 18 (SUTURE) ×3 IMPLANT
TOWEL OR 17X24 6PK STRL BLUE (TOWEL DISPOSABLE) ×3 IMPLANT
TOWEL OR 17X26 10 PK STRL BLUE (TOWEL DISPOSABLE) ×3 IMPLANT
TRAY LAPAROSCOPIC MC (CUSTOM PROCEDURE TRAY) ×3 IMPLANT
TROCAR XCEL BLUNT TIP 100MML (ENDOMECHANICALS) ×3 IMPLANT
TROCAR XCEL NON-BLD 11X100MML (ENDOMECHANICALS) ×3 IMPLANT
TROCAR XCEL NON-BLD 5MMX100MML (ENDOMECHANICALS) ×3 IMPLANT
TUBING INSUFFLATION (TUBING) ×3 IMPLANT
WATER STERILE IRR 1000ML POUR (IV SOLUTION) ×3 IMPLANT

## 2018-01-10 NOTE — Anesthesia Postprocedure Evaluation (Signed)
Anesthesia Post Note  Patient: Katelyn Anthony  Procedure(s) Performed: LAPAROSCOPIC CHOLECYSTECTOMY WITH INTRAOPERATIVE CHOLANGIOGRAM (N/A )     Patient location during evaluation: PACU Anesthesia Type: General Level of consciousness: awake and alert Pain management: pain level controlled Vital Signs Assessment: post-procedure vital signs reviewed and stable Respiratory status: spontaneous breathing, nonlabored ventilation, respiratory function stable and patient connected to nasal cannula oxygen Cardiovascular status: blood pressure returned to baseline and stable Postop Assessment: no apparent nausea or vomiting Anesthetic complications: no    Last Vitals:  Vitals:   01/10/18 1100 01/10/18 1142  BP: 134/68 (!) 157/70  Pulse: 64 67  Resp: 20 18  Temp:    SpO2: 100% 98%    Last Pain:  Vitals:   01/10/18 1115  TempSrc:   PainSc: 3                  Montez Hageman

## 2018-01-10 NOTE — Plan of Care (Signed)
  Problem: Activity: Goal: Risk for activity intolerance will decrease Outcome: Progressing   Problem: Nutrition: Goal: Adequate nutrition will be maintained Outcome: Progressing   Problem: Pain Managment: Goal: General experience of comfort will improve Outcome: Progressing   

## 2018-01-10 NOTE — Transfer of Care (Signed)
Immediate Anesthesia Transfer of Care Note  Patient: Katelyn Anthony  Procedure(s) Performed: LAPAROSCOPIC CHOLECYSTECTOMY WITH INTRAOPERATIVE CHOLANGIOGRAM (N/A )  Patient Location: PACU  Anesthesia Type:General  Level of Consciousness: sedated  Airway & Oxygen Therapy: Patient Spontanous Breathing and Patient connected to face mask oxygen  Post-op Assessment: Report given to RN and Post -op Vital signs reviewed and stable  Post vital signs: Reviewed and stable  Last Vitals:  Vitals Value Taken Time  BP 118/61 01/10/2018  9:46 AM  Temp    Pulse 56 01/10/2018  9:47 AM  Resp 15 01/10/2018  9:47 AM  SpO2 99 % 01/10/2018  9:47 AM  Vitals shown include unvalidated device data.  Last Pain:  Vitals:   01/10/18 0650  TempSrc: Oral  PainSc:          Complications: No apparent anesthesia complications

## 2018-01-10 NOTE — Plan of Care (Signed)
Neuro: Pt A&OX4, neuro exam WNL. Will continue to monitor.    Respiratory: Pt on RA with o2 sats WNL.   Cardiovascular: Pt afebrile and other VSS at this time.   GI/GU: Pt voiding, on clear liquid diet at this time, and tolerating PO intake with no reports of pain or N/V.    Skin: Skin intact with no s/s of skin breakdown at this time. Pt able to reposition independently. Pt with lap sites X4 on abdomen, skin glue in place and open to air.    Pain: Pt with no reports of pain at this time and receiving scheduled pain regimen.   Events: NO acute events throughout shift. Pts plan of care to continue with current regimen. Plan to d/c tomorrow to homw with self care.

## 2018-01-10 NOTE — Anesthesia Procedure Notes (Addendum)
Procedure Name: Intubation Date/Time: 01/10/2018 8:38 AM Performed by: Eulas Post, Marquelle Balow W, CRNA Pre-anesthesia Checklist: Patient identified, Emergency Drugs available, Suction available and Patient being monitored Patient Re-evaluated:Patient Re-evaluated prior to induction Oxygen Delivery Method: Circle system utilized Preoxygenation: Pre-oxygenation with 100% oxygen Induction Type: IV induction Ventilation: Mask ventilation without difficulty Laryngoscope Size: Miller and 2 Grade View: Grade II Tube type: Oral Tube size: 6.5 mm Number of attempts: 2 Airway Equipment and Method: Bougie stylet Placement Confirmation: ETT inserted through vocal cords under direct vision,  positive ETCO2 and breath sounds checked- equal and bilateral Secured at: 22 cm Tube secured with: Tape Dental Injury: Teeth and Oropharynx as per pre-operative assessment  Comments: DL with miller 2, epiglottis only, attempted intubation, immediately recognized esophageal intubation. PC DL, no view of cords, placed bougie, then ETT, positive end tidal CO2, =BBS

## 2018-01-10 NOTE — Interval H&P Note (Signed)
History and Physical Interval Note:  01/10/2018 8:01 AM  Katelyn Anthony  has presented today for surgery, with the diagnosis of Gallstones  The various methods of treatment have been discussed with the patient and family. After consideration of risks, benefits and other options for treatment, the patient has consented to  Procedure(s): LAPAROSCOPIC CHOLECYSTECTOMY WITH INTRAOPERATIVE CHOLANGIOGRAM (N/A) as a surgical intervention .  The patient's history has been reviewed, patient examined, no change in status, stable for surgery.  I have reviewed the patient's chart and labs.  Questions were answered to the patient's satisfaction.     Darene Lamer Jerrica Thorman

## 2018-01-10 NOTE — Op Note (Signed)
Preoperative diagnosis: Cholelithiasis and cholecystitis  Postoperative diagnosis: Cholelithiasis and cholecystitis  Surgical procedure: Laparoscopic cholecystectomy with intraoperative cholangiogram  Surgeon: Marland Kitchen T. Claud Gowan M.D.  Assistant: None  Anesthesia: General Endotracheal  Complications: None  Estimated blood loss: Minimal  Description of procedure: The patient brought to the operating room, placed in the supine position on the operating table, and general endotracheal anesthesia induced. The abdomen was widely sterilely prepped and draped. The patient had received preoperative IV antibiotics and PAS were in place. Patient timeout was performed the correct procedure verified. Standard 4 port technique was used with an open Hassan cannula at the umbilicus and the remainder of the ports placed under direct vision. The gallbladder was visualized. It appeared mildly chronically inflamed with a few omental adhesions. The fundus was grasped and elevated up over the liver and the infundibulum retracted inferiolaterally. Peritoneum anterior and posterior to close triangle was incised and fibrofatty tissue stripped off the neck of the gallbladder toward the porta hepatis. The distal gallbladder was thoroughly dissected. The cystic artery was identified in Calot's triangle and the cystic duct gallbladder junction dissected 360.  A good critical view was obtained. When the anatomy was clear the cystic duct was clipped at the gallbladder junction and an operative cholangiogram obtained through the cystic duct. This showed good filling of a normal common bile duct and intrahepatic ducts with free flow into the duodenum and no filling defects. Following this the cholangiocath was removed and the cystic duct was doubly clipped proximally and divided. The cystic artery was doubly clipped proximally and distally and divided. The gallbladder was dissected free from its bed using hook cautery and  removed through the umbilical port site. Complete hemostasis was obtained in the gallbladder bed. The right upper quadrant was thoroughly irrigated and hemostasis assured. Trochars were removed and all CO2 evacuated and the [facility] trocar site fascial defect closed. Skin incisions were closed with subcuticular Monocryl and Dermabond. Sponge needle and instrument counts were correct. The patient was taken to PACU in good condition.  Darene Lamer Jahna Liebert  01/10/2018

## 2018-01-10 NOTE — Anesthesia Preprocedure Evaluation (Signed)
Anesthesia Evaluation  Patient identified by MRN, date of birth, ID band Patient awake    Reviewed: Allergy & Precautions, NPO status , Patient's Chart, lab work & pertinent test results  Airway Mallampati: II  TM Distance: >3 FB Neck ROM: Full    Dental no notable dental hx.    Pulmonary neg pulmonary ROS, former smoker,    Pulmonary exam normal breath sounds clear to auscultation       Cardiovascular Normal cardiovascular exam Rhythm:Regular Rate:Normal     Neuro/Psych negative neurological ROS  negative psych ROS   GI/Hepatic negative GI ROS, Neg liver ROS,   Endo/Other  negative endocrine ROSHypothyroidism   Renal/GU negative Renal ROS  negative genitourinary   Musculoskeletal negative musculoskeletal ROS (+)   Abdominal   Peds negative pediatric ROS (+)  Hematology negative hematology ROS (+)   Anesthesia Other Findings   Reproductive/Obstetrics negative OB ROS                             Anesthesia Physical Anesthesia Plan  ASA: II  Anesthesia Plan: General   Post-op Pain Management:    Induction: Intravenous  PONV Risk Score and Plan: 3 and Ondansetron and Treatment may vary due to age or medical condition  Airway Management Planned: Oral ETT  Additional Equipment:   Intra-op Plan:   Post-operative Plan: Extubation in OR  Informed Consent: I have reviewed the patients History and Physical, chart, labs and discussed the procedure including the risks, benefits and alternatives for the proposed anesthesia with the patient or authorized representative who has indicated his/her understanding and acceptance.   Dental advisory given  Plan Discussed with: CRNA  Anesthesia Plan Comments:         Anesthesia Quick Evaluation

## 2018-01-10 NOTE — H&P (Signed)
History of Present Illness The patient is a 72 year old female who presents for evaluation of gallstones. Patient is a pleasant 72 year old female here for a couple of concerns. One is known symptomatic cholelithiasis. In 2013 she had an episode of severe epigastric abdominal pain. This lasted several days and then gradually improved. Associated with nausea and vomiting. Gallbladder ultrasound at that time was obtained showing multiple small gallstones and sludge. No thickening. Common bile duct was normal. Ever since she has managed her symptoms by restricting her diet. However she still gets episodic epigastric discomfort and some nausea when she eats the wrong types of foods. No fever chills or jaundice. She has had a partial colectomy by Dr. Kathreen Cornfield in the past with a right lower quadrant incision and also a hysterectomy. General health is good.  Past Medical History:  Diagnosis Date  . Allergy    feathers  . Cataract   . Cholelithiasis with cholecystitis    Chronic  . Hypothyroidism   . MVP (mitral valve prolapse)   . Osteopenia   . Pneumonia   . Thyroid disease    Past Surgical History:  Procedure Laterality Date  . ABDOMINAL HYSTERECTOMY    . APPENDECTOMY    . COLON SURGERY     diverticulitis  . COLONOSCOPY WITH PROPOFOL N/A 03/09/2016   Procedure: COLONOSCOPY WITH PROPOFOL;  Surgeon: Arta Silence, MD;  Location: [facility] ENDOSCOPY;  Service: Endoscopy;  Laterality: N/A;  . TONSILLECTOMY AND ADENOIDECTOMY       Allergies  Sulfa 10 *OPHTHALMIC AGENTS*  Allergies Reconciled   Medication History  Synthroid (75MCG Tablet, Oral) Active. Estrace (1MG  Tablet, Oral) Active. Medications Reconciled  BP (!) 184/79   Pulse 85   Temp (!) 97.3 F (36.3 C) (Oral)   Ht 5' 1.5" (1.562 m)   Wt 40.8 kg (90 lb)   SpO2 100%   BMI 16.73 kg/m    Physical Exam  The physical exam findings are as follows: Note:General: Alert, thin, petite Caucasian female, in no  distress Skin: Warm and dry without rash or infection. HEENT: No palpable masses or thyromegaly. Sclera nonicteric. Pupils equal round and reactive. Lymph nodes: No cervical, supraclavicular, or inguinal nodes palpable. Breasts: Somewhat nodular breasts bilaterally. There is point tenderness at the biopsy site lower outer left breast with some prominent nodularity here. Lungs: Breath sounds clear and equal. No wheezing or increased work of breathing. Cardiovascular: Regular rate and rhythm without murmer. No JVD or edema. Peripheral pulses intact. No carotid bruits. Abdomen: Nondistended. Mild epigastric tenderness. No masses palpable. No organomegaly. No palpable hernias. Extremities: No edema or joint swelling or deformity. No chronic venous stasis changes. Neurologic: Alert and fully oriented. Gait normal. No focal weakness. Psychiatric: Normal mood and affect. Thought content appropriate with normal judgement and insight    Assessment & Plan CALCULUS OF GALLBLADDER WITH CHRONIC CHOLECYSTITIS WITHOUT OBSTRUCTION (K80.10) Impression: Patient with known gallstones since an episode of severe pain in 2013. She has persistent symptoms of postprandial nausea and epigastric discomfort. I recommend proceeding with laparoscopic cholecystectomy in order to relieve her current symptoms and prevent potential complications from her gallstones. I discussed the procedure in detail. The patient was given Neurosurgeon. We discussed the risks and benefits of a laparoscopic cholecystectomy and possible cholangiogram including, but not limited to, bleeding, infection, injury to surrounding structures such as the intestine or liver, bile leak, retained gallstones, need to convert to an open procedure, prolonged diarrhea, blood clots such as DVT, common bile duct  injury, anesthesia risks, and possible need for additional procedures. The likelihood of improvement in symptoms and return to the patient's normal  status is good. We discussed the typical post-operative recovery course. All questions were answered. She would like to proceed.  Current Plans   Laparoscopic cholecystectomy with intraoperative cholangiogram under general anesthesia as an outpatient

## 2018-01-11 ENCOUNTER — Encounter (HOSPITAL_COMMUNITY): Payer: Self-pay | Admitting: General Surgery

## 2018-01-11 DIAGNOSIS — Z79899 Other long term (current) drug therapy: Secondary | ICD-10-CM | POA: Diagnosis not present

## 2018-01-11 DIAGNOSIS — I341 Nonrheumatic mitral (valve) prolapse: Secondary | ICD-10-CM | POA: Diagnosis not present

## 2018-01-11 DIAGNOSIS — Z881 Allergy status to other antibiotic agents status: Secondary | ICD-10-CM | POA: Diagnosis not present

## 2018-01-11 DIAGNOSIS — Z882 Allergy status to sulfonamides status: Secondary | ICD-10-CM | POA: Diagnosis not present

## 2018-01-11 DIAGNOSIS — Z880 Allergy status to penicillin: Secondary | ICD-10-CM | POA: Diagnosis not present

## 2018-01-11 DIAGNOSIS — K8010 Calculus of gallbladder with chronic cholecystitis without obstruction: Secondary | ICD-10-CM | POA: Diagnosis not present

## 2018-01-11 DIAGNOSIS — M8580 Other specified disorders of bone density and structure, unspecified site: Secondary | ICD-10-CM | POA: Diagnosis not present

## 2018-01-11 DIAGNOSIS — E039 Hypothyroidism, unspecified: Secondary | ICD-10-CM | POA: Diagnosis not present

## 2018-01-11 NOTE — Discharge Instructions (Signed)
[facility] ______[facility] °LAPAROSCOPIC SURGERY: POST OP INSTRUCTIONS °Always review your discharge instruction sheet given to you by the facility where your surgery was performed. °IF YOU HAVE DISABILITY OR FAMILY LEAVE FORMS, YOU MUST BRING THEM TO THE OFFICE FOR PROCESSING.   °DO NOT GIVE THEM TO YOUR DOCTOR. ° °1. A prescription for pain medication may be given to you upon discharge.  Take your pain medication as prescribed, if needed.  If narcotic pain medicine is not needed, then you may take acetaminophen (Tylenol) or ibuprofen (Advil) as needed. °2. Take your usually prescribed medications unless otherwise directed. °3. If you need a refill on your pain medication, please contact your pharmacy.  They will contact our office to request authorization. Prescriptions will not be filled after 5pm or on week-ends. °4. You should follow a light diet the first few days after arrival home, such as soup and crackers, etc.  Be sure to include lots of fluids daily. °5. Most patients will experience some swelling and bruising in the area of the incisions.  Ice packs will help.  Swelling and bruising can take several days to resolve.  °6. It is common to experience some constipation if taking pain medication after surgery.  Increasing fluid intake and taking a stool softener (such as Colace) will usually help or prevent this problem from occurring.  A mild laxative (Milk of Magnesia or Miralax) should be taken according to package instructions if there are no bowel movements after 48 hours. °7. Unless discharge instructions indicate otherwise, you may remove your bandages 24-48 hours after surgery, and you may shower at that time.  You may have steri-strips (small skin tapes) in place directly over the incision.  These strips should be left on the skin for 7-10 days.  If your surgeon used skin glue on the incision, you may shower in 24 hours.  The glue will flake off over the next 2-3 weeks.  Any sutures or  staples will be removed at the office during your follow-up visit. °8. ACTIVITIES:  You may resume regular (light) daily activities beginning the next day--such as daily self-care, walking, climbing stairs--gradually increasing activities as tolerated.  You may have sexual intercourse when it is comfortable.  Refrain from any heavy lifting or straining until approved by your doctor. °a. You may drive when you are no longer taking prescription pain medication, you can comfortably wear a seatbelt, and you can safely maneuver your car and apply brakes. °b. RETURN TO WORK:  __________________________________________________________ °9. You should see your doctor in the office for a follow-up appointment approximately 2-3 weeks after your surgery.  Make sure that you call for this appointment within a day or two after you arrive home to insure a convenient appointment time. °10. OTHER INSTRUCTIONS: __________________________________________________________________________________________________________________________ __________________________________________________________________________________________________________________________ °WHEN TO CALL YOUR DOCTOR: °1. Fever over 101.0 °2. Inability to urinate °3. Continued bleeding from incision. °4. Increased pain, redness, or drainage from the incision. °5. Increasing abdominal pain ° °The clinic staff is available to answer your questions during regular business hours.  Please don’t hesitate to call and ask to speak to one of the nurses for clinical concerns.  If you have a medical emergency, go to the nearest emergency room or call 911.  A surgeon from [facility] is always on call at the hospital. °[address], [address], [place], [place]  [address] ? [address] [address], [place], [place]   [address] °[phone] ? [phone] ? FAX [phone] °Web site:   [email or web address] °

## 2018-01-11 NOTE — Discharge Summary (Signed)
  Patient ID: Katelyn Anthony [id number] 72 y.o. Dec 12, 1945  01/10/2018  Discharge date and time: 01/11/2018   Admitting Physician: Edward Jolly  Discharge Physician: Edward Jolly  Admission Diagnoses: Cholelithiasis and cholecystitis  Discharge Diagnoses: Same  Operations: Procedure(s): LAPAROSCOPIC CHOLECYSTECTOMY WITH INTRAOPERATIVE CHOLANGIOGRAM  Admission Condition: fair  Discharged Condition: good  Indication for Admission: Patient with known cholelithiasis and episode of severe biliary colic several years ago.  She is severely limited her diet and continues to have postprandial discomfort and bloating in the epigastrium.  After preoperative discussion detailed elsewhere she is electively admitted for laparoscopic cholecystectomy.  Hospital Course: Patient underwent an uneventful laparoscopic cholecystectomy with normal intraoperative cholangiogram.  She was observed overnight.  Her course was uncomplicated.  The following morning she is afebrile with normal vital signs.  Abdomen is soft with minimal incisional tenderness.  Incisions are clean and dry.  She has mild pain and is tolerating diet.  Ambulatory.  Felt ready for discharge.   Disposition: Home  Patient Instructions:  Allergies as of 01/11/2018      Reactions   Amoxicillin-pot Clavulanate Diarrhea    muscle pain, can tolerate short durations of amoxicillin  Has patient had a PCN reaction causing immediate rash, facial/tongue/throat swelling, SOB or lightheadedness with hypotension: No Has patient had a PCN reaction causing severe rash involving mucus membranes or skin necrosis: No Has patient had a PCN reaction that required hospitalization: Unknown Has patient had a PCN reaction occurring within the last 10 years: Unknown   Azithromycin Hives   Levofloxacin Other (See Comments)   REACTION: itchy skin and bloody stools   Naproxen Sodium Rash   Sulfonamide Derivatives Other (See Comments)   combative       Medication List    STOP taking these medications   magic mouthwash Soln     TAKE these medications   amoxicillin 500 MG tablet Commonly known as:  AMOXIL Take 500 mg by mouth as needed. Prior to dental appointments   levothyroxine 75 MCG tablet Commonly known as:  SYNTHROID, LEVOTHROID Take 75 mcg by mouth daily before breakfast. What changed:  Another medication with the same name was removed. Continue taking this medication, and follow the directions you see here.   senna 8.6 MG tablet Commonly known as:  SENOKOT Take 1 tablet by mouth daily as needed for constipation.   terconazole 0.8 % vaginal cream Commonly known as:  TERAZOL 3 Place 1 applicator vaginally at bedtime.   Vitamin D3 5000 units Caps Take 5,000 Units by mouth daily.   VIVELLE-DOT 0.075 MG/24HR Generic drug:  estradiol Place 1 patch onto the skin 2 (two) times a week.       Activity: activity as tolerated Diet: regular diet Wound Care: none needed  Follow-up:  With Thao Vanover in 1 month.  Signed: Edward Jolly MD, FACS  01/11/2018, 8:33 AM

## 2018-03-06 DIAGNOSIS — J019 Acute sinusitis, unspecified: Secondary | ICD-10-CM | POA: Diagnosis not present

## 2018-05-01 DIAGNOSIS — R110 Nausea: Secondary | ICD-10-CM | POA: Diagnosis not present

## 2018-05-15 DIAGNOSIS — H6983 Other specified disorders of Eustachian tube, bilateral: Secondary | ICD-10-CM | POA: Diagnosis not present

## 2018-07-20 DIAGNOSIS — J0101 Acute recurrent maxillary sinusitis: Secondary | ICD-10-CM | POA: Diagnosis not present

## 2018-07-20 DIAGNOSIS — E539 Vitamin B deficiency, unspecified: Secondary | ICD-10-CM | POA: Diagnosis not present

## 2018-07-20 DIAGNOSIS — E538 Deficiency of other specified B group vitamins: Secondary | ICD-10-CM | POA: Diagnosis not present

## 2018-07-20 DIAGNOSIS — Z79899 Other long term (current) drug therapy: Secondary | ICD-10-CM | POA: Diagnosis not present

## 2018-07-20 DIAGNOSIS — N390 Urinary tract infection, site not specified: Secondary | ICD-10-CM | POA: Diagnosis not present

## 2018-07-20 DIAGNOSIS — R109 Unspecified abdominal pain: Secondary | ICD-10-CM | POA: Diagnosis not present

## 2018-09-12 DIAGNOSIS — Z23 Encounter for immunization: Secondary | ICD-10-CM | POA: Diagnosis not present

## 2018-11-19 DIAGNOSIS — N951 Menopausal and female climacteric states: Secondary | ICD-10-CM | POA: Diagnosis not present

## 2018-12-26 DIAGNOSIS — R197 Diarrhea, unspecified: Secondary | ICD-10-CM | POA: Diagnosis not present

## 2019-01-01 ENCOUNTER — Other Ambulatory Visit: Payer: Self-pay | Admitting: Family Medicine

## 2019-01-01 DIAGNOSIS — R1013 Epigastric pain: Secondary | ICD-10-CM

## 2019-01-03 DIAGNOSIS — R1011 Right upper quadrant pain: Secondary | ICD-10-CM | POA: Diagnosis not present

## 2019-01-03 DIAGNOSIS — Z5181 Encounter for therapeutic drug level monitoring: Secondary | ICD-10-CM | POA: Diagnosis not present

## 2019-01-03 DIAGNOSIS — E039 Hypothyroidism, unspecified: Secondary | ICD-10-CM | POA: Diagnosis not present

## 2019-01-03 DIAGNOSIS — E785 Hyperlipidemia, unspecified: Secondary | ICD-10-CM | POA: Diagnosis not present

## 2019-01-10 ENCOUNTER — Other Ambulatory Visit: Payer: PPO

## 2019-01-10 ENCOUNTER — Ambulatory Visit
Admission: RE | Admit: 2019-01-10 | Discharge: 2019-01-10 | Disposition: A | Discharge status: Home / Self Care | Payer: Medicare PPO | Source: Ambulatory Visit | Attending: Family Medicine | Admitting: Family Medicine

## 2019-01-10 DIAGNOSIS — B1920 Unspecified viral hepatitis C without hepatic coma: Secondary | ICD-10-CM | POA: Diagnosis not present

## 2019-01-10 DIAGNOSIS — R1013 Epigastric pain: Secondary | ICD-10-CM

## 2019-01-10 DIAGNOSIS — Z9049 Acquired absence of other specified parts of digestive tract: Secondary | ICD-10-CM | POA: Diagnosis not present

## 2019-01-17 DIAGNOSIS — R143 Flatulence: Secondary | ICD-10-CM | POA: Diagnosis not present

## 2019-03-04 DIAGNOSIS — Z90710 Acquired absence of both cervix and uterus: Secondary | ICD-10-CM | POA: Diagnosis not present

## 2019-03-04 DIAGNOSIS — Z124 Encounter for screening for malignant neoplasm of cervix: Secondary | ICD-10-CM | POA: Diagnosis not present

## 2019-03-04 DIAGNOSIS — Z681 Body mass index (BMI) 19 or less, adult: Secondary | ICD-10-CM | POA: Diagnosis not present

## 2019-03-04 DIAGNOSIS — Z1272 Encounter for screening for malignant neoplasm of vagina: Secondary | ICD-10-CM | POA: Diagnosis not present

## 2019-03-12 ENCOUNTER — Other Ambulatory Visit: Payer: Self-pay | Admitting: Obstetrics & Gynecology

## 2019-03-12 DIAGNOSIS — N644 Mastodynia: Secondary | ICD-10-CM

## 2019-03-18 ENCOUNTER — Other Ambulatory Visit: Payer: Self-pay

## 2019-03-18 ENCOUNTER — Ambulatory Visit
Admission: RE | Admit: 2019-03-18 | Discharge: 2019-03-18 | Disposition: A | Discharge status: Home / Self Care | Payer: Medicare PPO | Source: Ambulatory Visit | Attending: Obstetrics & Gynecology | Admitting: Obstetrics & Gynecology

## 2019-03-18 DIAGNOSIS — N644 Mastodynia: Secondary | ICD-10-CM

## 2019-03-29 DIAGNOSIS — H00011 Hordeolum externum right upper eyelid: Secondary | ICD-10-CM | POA: Diagnosis not present

## 2019-04-04 DIAGNOSIS — E538 Deficiency of other specified B group vitamins: Secondary | ICD-10-CM | POA: Diagnosis not present

## 2019-04-04 DIAGNOSIS — E039 Hypothyroidism, unspecified: Secondary | ICD-10-CM | POA: Diagnosis not present

## 2019-04-23 DIAGNOSIS — Z23 Encounter for immunization: Secondary | ICD-10-CM | POA: Diagnosis not present

## 2019-07-15 DIAGNOSIS — R252 Cramp and spasm: Secondary | ICD-10-CM | POA: Diagnosis not present

## 2019-07-24 DIAGNOSIS — R5382 Chronic fatigue, unspecified: Secondary | ICD-10-CM | POA: Diagnosis not present

## 2019-07-24 DIAGNOSIS — M1990 Unspecified osteoarthritis, unspecified site: Secondary | ICD-10-CM | POA: Diagnosis not present

## 2019-07-24 DIAGNOSIS — E611 Iron deficiency: Secondary | ICD-10-CM | POA: Diagnosis not present

## 2019-07-24 DIAGNOSIS — E559 Vitamin D deficiency, unspecified: Secondary | ICD-10-CM | POA: Diagnosis not present

## 2019-07-24 DIAGNOSIS — E538 Deficiency of other specified B group vitamins: Secondary | ICD-10-CM | POA: Diagnosis not present

## 2019-08-19 DIAGNOSIS — R69 Illness, unspecified: Secondary | ICD-10-CM | POA: Diagnosis not present

## 2019-08-24 ENCOUNTER — Ambulatory Visit: Payer: Medicare PPO

## 2019-08-29 ENCOUNTER — Ambulatory Visit: Payer: Medicare PPO

## 2019-09-01 ENCOUNTER — Ambulatory Visit: Payer: Medicare HMO | Attending: Internal Medicine

## 2019-09-01 ENCOUNTER — Ambulatory Visit: Payer: Self-pay

## 2019-09-01 DIAGNOSIS — Z23 Encounter for immunization: Secondary | ICD-10-CM | POA: Insufficient documentation

## 2019-09-01 NOTE — Progress Notes (Signed)
   Covid-19 Vaccination Clinic  Name:  Katelyn Anthony    MRN: [id number] DOB: 1946-01-09  09/01/2019  [name] was observed post Covid-19 immunization for 15 minutes without incidence. She was provided with Vaccine Information Sheet and instruction to access the V-Safe system.   [name] was instructed to call 911 with any severe reactions post vaccine: Marland Kitchen Difficulty breathing  . Swelling of your face and throat  . A fast heartbeat  . A bad rash all over your body  . Dizziness and weakness    Immunizations Administered    Name Date Dose VIS Date Route   Pfizer COVID-19 Vaccine 09/01/2019  4:26 PM 0.3 mL 07/05/2019 Intramuscular   Manufacturer: [place]   Lot: [id number]   [place]: [id number]

## 2019-09-26 ENCOUNTER — Ambulatory Visit: Payer: Medicare HMO | Attending: Internal Medicine

## 2019-09-26 DIAGNOSIS — Z23 Encounter for immunization: Secondary | ICD-10-CM

## 2019-09-26 NOTE — Progress Notes (Signed)
   Covid-19 Vaccination Clinic  Name:  Katelyn Anthony    MRN: [id number] DOB: 08-28-1945  09/26/2019  Katelyn Anthony was observed post Covid-19 immunization for 15 minutes without incident. She was provided with Vaccine Information Sheet and instruction to access the V-Safe system.   Katelyn Anthony was instructed to call 911 with any severe reactions post vaccine: Marland Kitchen Difficulty breathing  . Swelling of face and throat  . A fast heartbeat  . A bad rash all over body  . Dizziness and weakness   Immunizations Administered    Name Date Dose VIS Date Route   Pfizer COVID-19 Vaccine 09/26/2019  3:51 PM 0.3 mL 07/05/2019 Intramuscular   Manufacturer: [place]   Lot: [id number]   [place]: [id number]

## 2019-09-30 DIAGNOSIS — R252 Cramp and spasm: Secondary | ICD-10-CM | POA: Diagnosis not present

## 2019-09-30 DIAGNOSIS — R768 Other specified abnormal immunological findings in serum: Secondary | ICD-10-CM | POA: Diagnosis not present

## 2019-09-30 DIAGNOSIS — M150 Primary generalized (osteo)arthritis: Secondary | ICD-10-CM | POA: Diagnosis not present

## 2019-10-14 DIAGNOSIS — M62838 Other muscle spasm: Secondary | ICD-10-CM | POA: Diagnosis not present

## 2019-10-17 ENCOUNTER — Ambulatory Visit: Payer: Medicare HMO | Admitting: Internal Medicine

## 2019-11-22 ENCOUNTER — Ambulatory Visit: Payer: Medicare HMO | Admitting: Internal Medicine

## 2019-11-26 ENCOUNTER — Ambulatory Visit: Payer: Medicare HMO | Admitting: Internal Medicine

## 2019-11-26 ENCOUNTER — Other Ambulatory Visit: Payer: Self-pay

## 2019-11-26 ENCOUNTER — Encounter: Payer: Self-pay | Admitting: Internal Medicine

## 2019-11-26 ENCOUNTER — Other Ambulatory Visit (INDEPENDENT_AMBULATORY_CARE_PROVIDER_SITE_OTHER): Payer: Medicare HMO

## 2019-11-26 VITALS — BP 152/62 | HR 68 | Temp 97.9°F | Ht 61.0 in | Wt 90.2 lb

## 2019-11-26 DIAGNOSIS — E039 Hypothyroidism, unspecified: Secondary | ICD-10-CM | POA: Diagnosis not present

## 2019-11-26 LAB — T4, FREE: Free T4: 1.28 ng/dL (ref 0.60–1.60)

## 2019-11-26 LAB — TSH: TSH: 0.54 u[IU]/mL (ref 0.35–4.50)

## 2019-11-26 NOTE — Patient Instructions (Signed)
You are on Synthroid- which is your thyroid hormone supplement. You MUST take this consistently.  You should take this first thing in the morning on an empty stomach with water. You should not take it with other medications. Wait 30min to 1hr prior to eating. If you are taking any vitamins - please take these in the evening.   If you miss a dose, please take your missed dose the following day (double the dose for that day). You should have a pill box for ONLY levothyroxine on your bedside table to help you remember to take your medications.  

## 2019-11-26 NOTE — Progress Notes (Signed)
Name: Katelyn Anthony  MRN/ DOB: [id number], 11/23/1945    Age/ Sex: 74 y.o., female    PCP: Maurice Small, MD   Reason for Endocrinology Evaluation: Hypothyroidism     Date of Initial Endocrinology Evaluation: 11/26/2019     HPI: Katelyn Anthony is a 74 y.o. female with a past medical history of hypothyroidism, dyslipidemia and MVP. The patient presented for initial endocrinology clinic visit on 11/26/2019 for consultative assistance with her Hypothyroidism.   She has been diagnosed with hypothyroidism since 1990'. She uses brand only .  Her dose has ranged from 50- 75 mcg over the years.    Has issues with falling or keeping asleep , Synthroid helps her fall asleep, would prefer to take it at night   Her energy level is stable  Has chronic constipation but has improved since cholecystectomy  Hair is falling , has muscle cramps   She  Is not on MVI or biotin    HISTORY:  Past Medical History:  Past Medical History:  Diagnosis Date  . Allergy    feathers  . Cataract   . Cholelithiasis with cholecystitis    Chronic  . Hypothyroidism   . MVP (mitral valve prolapse)   . Osteopenia   . Pneumonia   . Thyroid disease    Past Surgical History:  Past Surgical History:  Procedure Laterality Date  . ABDOMINAL HYSTERECTOMY    . APPENDECTOMY    . CHOLECYSTECTOMY N/A 01/10/2018   Procedure: LAPAROSCOPIC CHOLECYSTECTOMY WITH INTRAOPERATIVE CHOLANGIOGRAM;  Surgeon: Excell Seltzer, MD;  Location: [facility];  Service: General;  Laterality: N/A;  . COLON SURGERY     diverticulitis  . COLONOSCOPY WITH PROPOFOL N/A 03/09/2016   Procedure: COLONOSCOPY WITH PROPOFOL;  Surgeon: Arta Silence, MD;  Location: [facility] ENDOSCOPY;  Service: Endoscopy;  Laterality: N/A;  . TONSILLECTOMY AND ADENOIDECTOMY        Social History:  reports that she has quit smoking. Her smoking use included cigarettes. She has never used smokeless tobacco. She reports current alcohol use of about 2.0 standard  drinks of alcohol per week. She reports that she does not use drugs.  Family History: family history includes Colon cancer in her mother.   HOME MEDICATIONS: Allergies as of 11/26/2019      Reactions   Amoxicillin-pot Clavulanate Diarrhea    muscle pain, can tolerate short durations of amoxicillin  Has patient had a PCN reaction causing immediate rash, facial/tongue/throat swelling, SOB or lightheadedness with hypotension: No Has patient had a PCN reaction causing severe rash involving mucus membranes or skin necrosis: No Has patient had a PCN reaction that required hospitalization: Unknown Has patient had a PCN reaction occurring within the last 10 years: Unknown   Azithromycin Hives   Levofloxacin Other (See Comments)   REACTION: itchy skin and bloody stools   Naproxen Sodium Rash   Sulfonamide Derivatives Other (See Comments)   combative      Medication List       Accurate as of Nov 26, 2019  4:03 PM. If you have any questions, ask your nurse or doctor.        amoxicillin 500 MG tablet Commonly known as: AMOXIL Take 500 mg by mouth as needed. Prior to dental appointments   levothyroxine 75 MCG tablet Commonly known as: SYNTHROID Take 75 mcg by mouth daily before breakfast.   senna 8.6 MG tablet Commonly known as: SENOKOT Take 1 tablet by mouth daily as needed for constipation.   terconazole  0.8 % vaginal cream Commonly known as: TERAZOL 3 Place 1 applicator vaginally at bedtime.   vitamin B-12 500 MCG tablet Commonly known as: CYANOCOBALAMIN Take 500 mcg by mouth daily.   Vitamin D3 125 MCG (5000 UT) Caps Take 5,000 Units by mouth. Every other day   Vivelle-Dot 0.075 MG/24HR Generic drug: estradiol Place 1 patch onto the skin. Once every 2 weeks         REVIEW OF SYSTEMS: A comprehensive ROS was conducted with the patient and is negative except as per HPI and below:  ROS     OBJECTIVE:  VS: BP (!) 152/62 (BP Location: Left Arm, Patient Position:  Sitting, Cuff Size: Normal)   Pulse 68   Temp 97.9 F (36.6 C)   Ht 5\' 1"  (1.549 m)   Wt 90 lb 3.2 oz (40.9 kg)   SpO2 98%   BMI 17.04 kg/m    Wt Readings from Last 3 Encounters:  11/26/19 90 lb 3.2 oz (40.9 kg)  01/10/18 94 lb 9.2 oz (42.9 kg)  12/29/17 91 lb 6.4 oz (41.5 kg)     EXAM: General: Pt appears well and is in NAD  Eyes: External eye exam normal without stare, lid lag or exophthalmos.  EOM intact.    Neck: General: Supple without adenopathy. Thyroid: Thyroid size normal.  No goiter or nodules appreciated. No thyroid bruit.  Lungs: Clear with good BS bilat with no rales, rhonchi, or wheezes  Heart: Auscultation: RRR.  Abdomen: Normoactive bowel sounds, soft, nontender, without masses or organomegaly palpable  Extremities:  BL LE: No pretibial edema normal ROM and strength.  Skin: Hair: Texture and amount normal with gender appropriate distribution Skin Inspection: No rashes Skin Palpation: Skin temperature, texture, and thickness normal to palpation  Neuro: Cranial nerves: II - XII grossly intact  Motor: Normal strength throughout DTRs: 2+ and symmetric in UE without delay in relaxation phase  Mental Status: Judgment, insight: Intact Orientation: Oriented to time, place, and person Mood and affect: No depression, anxiety, or agitation     DATA REVIEWED:  07/24/2019  TSH 6.82 uIU/mL    Results for ENEDELIA, VARELA (MRN [id number]) as of 11/27/2019 07:10  Ref. Range 11/26/2019 15:22  TSH Latest Ref Range: 0.35 - 4.50 uIU/mL 0.54  T4,Free(Direct) Latest Ref Range: 0.60 - 1.60 ng/dL 1.28    ASSESSMENT/PLAN/RECOMMENDATIONS:   1. Hypothyroidism:  - Pt with non-specific symptoms - No local neck symptoms - She would like to take her LT-4 replacement at night , as she believes it helps her sleep, we discussed the preference of taking it on an empty stomach for better absorption profile but we can try taking it at night and adjust the dose accordingly - Repeat TFT's  are normal  Medications : Synthroid 75 mcg daily    F/U in 4 months  Labs in 8 weeks   Signed electronically by: Mack Guise, MD  [facility]  [facility] [address] [place], [place] [address] Phone: [phone] FAX: [phone]   CC: Maurice Small, MD [address] [place] [address] Phone: [phone] Fax: [phone]   Return to Endocrinology clinic as below: Future Appointments  Date Time Provider [place]  01/22/2020  3:30 PM [facility] None  04/02/2020 11:10 AM Kamilla Hands, Melanie Crazier, MD [facility] None

## 2019-11-27 MED ORDER — LEVOTHYROXINE SODIUM 75 MCG PO TABS: 75.0000 ug | ORAL_TABLET | Freq: Every day | ORAL | 3 refills | Status: DC

## 2020-01-22 ENCOUNTER — Other Ambulatory Visit: Payer: Medicare HMO

## 2020-02-18 ENCOUNTER — Other Ambulatory Visit: Payer: Self-pay

## 2020-02-18 ENCOUNTER — Other Ambulatory Visit (INDEPENDENT_AMBULATORY_CARE_PROVIDER_SITE_OTHER): Payer: Medicare HMO

## 2020-02-18 DIAGNOSIS — E039 Hypothyroidism, unspecified: Secondary | ICD-10-CM

## 2020-02-19 ENCOUNTER — Other Ambulatory Visit: Payer: Medicare HMO

## 2020-02-19 ENCOUNTER — Encounter: Payer: Self-pay | Admitting: Internal Medicine

## 2020-02-19 LAB — TSH: TSH: 0.16 u[IU]/mL — ABNORMAL LOW (ref 0.35–4.50)

## 2020-02-19 LAB — T4, FREE: Free T4: 1.22 ng/dL (ref 0.60–1.60)

## 2020-02-19 NOTE — Telephone Encounter (Signed)
Patient called stating she is unable to read the message you sent her on mychart. She asked if you would be able to resend the message regarding lab results, or if you are able to just call her back on Friday after 1. Ph# [phone]

## 2020-02-21 NOTE — Telephone Encounter (Signed)
Patient called asking if her B-12 500 mg is interfering with her thyroid medication? And is B-12 the same thing as Biotin? Patient ph# [phone]

## 2020-03-13 DIAGNOSIS — L851 Acquired keratosis [keratoderma] palmaris et plantaris: Secondary | ICD-10-CM | POA: Diagnosis not present

## 2020-03-13 DIAGNOSIS — M216X1 Other acquired deformities of right foot: Secondary | ICD-10-CM | POA: Diagnosis not present

## 2020-03-19 DIAGNOSIS — E559 Vitamin D deficiency, unspecified: Secondary | ICD-10-CM | POA: Diagnosis not present

## 2020-03-19 DIAGNOSIS — R5382 Chronic fatigue, unspecified: Secondary | ICD-10-CM | POA: Diagnosis not present

## 2020-03-19 DIAGNOSIS — E538 Deficiency of other specified B group vitamins: Secondary | ICD-10-CM | POA: Diagnosis not present

## 2020-03-19 DIAGNOSIS — E039 Hypothyroidism, unspecified: Secondary | ICD-10-CM | POA: Diagnosis not present

## 2020-03-19 DIAGNOSIS — E349 Endocrine disorder, unspecified: Secondary | ICD-10-CM | POA: Diagnosis not present

## 2020-03-19 DIAGNOSIS — E60 Dietary zinc deficiency: Secondary | ICD-10-CM | POA: Diagnosis not present

## 2020-03-19 DIAGNOSIS — L659 Nonscarring hair loss, unspecified: Secondary | ICD-10-CM | POA: Diagnosis not present

## 2020-03-19 DIAGNOSIS — E611 Iron deficiency: Secondary | ICD-10-CM | POA: Diagnosis not present

## 2020-03-24 DIAGNOSIS — E039 Hypothyroidism, unspecified: Secondary | ICD-10-CM | POA: Diagnosis not present

## 2020-04-02 ENCOUNTER — Ambulatory Visit: Payer: Medicare HMO | Admitting: Internal Medicine

## 2020-04-23 DIAGNOSIS — Z23 Encounter for immunization: Secondary | ICD-10-CM | POA: Diagnosis not present

## 2020-05-05 ENCOUNTER — Other Ambulatory Visit: Payer: Self-pay | Admitting: Obstetrics & Gynecology

## 2020-05-05 DIAGNOSIS — Z1231 Encounter for screening mammogram for malignant neoplasm of breast: Secondary | ICD-10-CM

## 2020-05-13 DIAGNOSIS — H04129 Dry eye syndrome of unspecified lacrimal gland: Secondary | ICD-10-CM | POA: Diagnosis not present

## 2020-05-13 DIAGNOSIS — H524 Presbyopia: Secondary | ICD-10-CM | POA: Diagnosis not present

## 2020-05-13 DIAGNOSIS — H52203 Unspecified astigmatism, bilateral: Secondary | ICD-10-CM | POA: Diagnosis not present

## 2020-05-13 DIAGNOSIS — H2513 Age-related nuclear cataract, bilateral: Secondary | ICD-10-CM | POA: Diagnosis not present

## 2020-05-13 DIAGNOSIS — H5213 Myopia, bilateral: Secondary | ICD-10-CM | POA: Diagnosis not present

## 2020-05-15 ENCOUNTER — Other Ambulatory Visit (HOSPITAL_BASED_OUTPATIENT_CLINIC_OR_DEPARTMENT_OTHER): Payer: Self-pay | Admitting: Internal Medicine

## 2020-05-15 ENCOUNTER — Ambulatory Visit: Payer: Medicare HMO | Attending: Internal Medicine

## 2020-05-15 DIAGNOSIS — Z23 Encounter for immunization: Secondary | ICD-10-CM

## 2020-05-15 NOTE — Progress Notes (Signed)
   Covid-19 Vaccination Clinic  Name:  NANDI TONNESEN    MRN: [id number] DOB: 11/20/1945  05/15/2020  Ms. Hribar was observed post Covid-19 immunization for 15 minutes without incident. She was provided with Vaccine Information Sheet and instruction to access the V-Safe system.  Vaccinated by [facility]  Ms. Baca was instructed to call 911 with any severe reactions post vaccine: Marland Kitchen Difficulty breathing  . Swelling of face and throat  . A fast heartbeat  . A bad rash all over body  . Dizziness and weakness

## 2020-05-20 MED FILL — PFIZER-BIONTECH COVID-19 VA: 30 | 1 days supply | Qty: 0 | Fill #0

## 2020-06-05 ENCOUNTER — Ambulatory Visit: Payer: Medicare HMO

## 2020-06-05 DIAGNOSIS — L851 Acquired keratosis [keratoderma] palmaris et plantaris: Secondary | ICD-10-CM | POA: Diagnosis not present

## 2020-06-05 DIAGNOSIS — M216X1 Other acquired deformities of right foot: Secondary | ICD-10-CM | POA: Diagnosis not present

## 2020-07-21 DIAGNOSIS — J0100 Acute maxillary sinusitis, unspecified: Secondary | ICD-10-CM | POA: Diagnosis not present

## 2020-07-21 DIAGNOSIS — R69 Illness, unspecified: Secondary | ICD-10-CM | POA: Diagnosis not present

## 2020-08-13 DIAGNOSIS — H66002 Acute suppurative otitis media without spontaneous rupture of ear drum, left ear: Secondary | ICD-10-CM | POA: Diagnosis not present

## 2020-08-13 DIAGNOSIS — H6123 Impacted cerumen, bilateral: Secondary | ICD-10-CM | POA: Diagnosis not present

## 2020-08-24 DIAGNOSIS — M7742 Metatarsalgia, left foot: Secondary | ICD-10-CM | POA: Diagnosis not present

## 2020-08-25 DIAGNOSIS — M7742 Metatarsalgia, left foot: Secondary | ICD-10-CM | POA: Insufficient documentation

## 2020-09-19 ENCOUNTER — Other Ambulatory Visit: Payer: Self-pay

## 2020-09-19 ENCOUNTER — Ambulatory Visit
Admission: RE | Admit: 2020-09-19 | Discharge: 2020-09-19 | Disposition: A | Discharge status: Home / Self Care | Payer: Medicare HMO | Source: Ambulatory Visit | Attending: Obstetrics & Gynecology | Admitting: Obstetrics & Gynecology

## 2020-09-19 DIAGNOSIS — Z1231 Encounter for screening mammogram for malignant neoplasm of breast: Secondary | ICD-10-CM | POA: Diagnosis not present

## 2020-10-08 ENCOUNTER — Ambulatory Visit: Payer: Medicare HMO

## 2020-10-08 ENCOUNTER — Encounter: Payer: Self-pay | Admitting: Podiatry

## 2020-10-08 ENCOUNTER — Other Ambulatory Visit: Payer: Self-pay

## 2020-10-08 ENCOUNTER — Ambulatory Visit: Payer: Medicare HMO | Admitting: Podiatry

## 2020-10-08 DIAGNOSIS — Z780 Asymptomatic menopausal state: Secondary | ICD-10-CM | POA: Insufficient documentation

## 2020-10-08 DIAGNOSIS — G9332 Myalgic encephalomyelitis/chronic fatigue syndrome: Secondary | ICD-10-CM | POA: Insufficient documentation

## 2020-10-08 DIAGNOSIS — Z8371 Family history of colonic polyps: Secondary | ICD-10-CM | POA: Insufficient documentation

## 2020-10-08 DIAGNOSIS — M810 Age-related osteoporosis without current pathological fracture: Secondary | ICD-10-CM | POA: Insufficient documentation

## 2020-10-08 DIAGNOSIS — G4700 Insomnia, unspecified: Secondary | ICD-10-CM | POA: Insufficient documentation

## 2020-10-08 DIAGNOSIS — D2371 Other benign neoplasm of skin of right lower limb, including hip: Secondary | ICD-10-CM

## 2020-10-08 DIAGNOSIS — K5900 Constipation, unspecified: Secondary | ICD-10-CM | POA: Insufficient documentation

## 2020-10-08 DIAGNOSIS — M1990 Unspecified osteoarthritis, unspecified site: Secondary | ICD-10-CM | POA: Insufficient documentation

## 2020-10-08 DIAGNOSIS — I341 Nonrheumatic mitral (valve) prolapse: Secondary | ICD-10-CM | POA: Insufficient documentation

## 2020-10-08 DIAGNOSIS — E785 Hyperlipidemia, unspecified: Secondary | ICD-10-CM | POA: Insufficient documentation

## 2020-10-08 DIAGNOSIS — M778 Other enthesopathies, not elsewhere classified: Secondary | ICD-10-CM | POA: Diagnosis not present

## 2020-10-08 DIAGNOSIS — E559 Vitamin D deficiency, unspecified: Secondary | ICD-10-CM | POA: Insufficient documentation

## 2020-10-08 DIAGNOSIS — R5382 Chronic fatigue, unspecified: Secondary | ICD-10-CM | POA: Insufficient documentation

## 2020-10-08 NOTE — Progress Notes (Signed)
Subjective:  Patient ID: Katelyn [place], female    DOB: [birth year],  MRN: [id number] HPI Chief Complaint  Patient presents with  . Foot Pain    Plantar forefoot left - "I have a neuroma", feels like walking on hot rocks, tried padding but feels like too much pressure causing pain, callused, tried shaving down, tried change in shoes  . New Patient (Initial Visit)    75 y.o. female presents with the above complaint.   ROS: Denies fever chills nausea vomiting muscle aches pains calf pain back pain chest pain shortness of breath.  Past Medical History:  Diagnosis Date  . Allergy    feathers  . Cataract   . Cholelithiasis with cholecystitis    Chronic  . Hypothyroidism   . MVP (mitral valve prolapse)   . Osteopenia   . Pneumonia   . Thyroid disease    Past Surgical History:  Procedure Laterality Date  . ABDOMINAL HYSTERECTOMY    . APPENDECTOMY    . CHOLECYSTECTOMY N/A 01/10/2018   Procedure: LAPAROSCOPIC CHOLECYSTECTOMY WITH INTRAOPERATIVE CHOLANGIOGRAM;  Surgeon: Excell Seltzer, MD;  Location: [place];  Service: General;  Laterality: N/A;  . COLON SURGERY     diverticulitis  . COLONOSCOPY WITH PROPOFOL N/A 03/09/2016   Procedure: COLONOSCOPY WITH PROPOFOL;  Surgeon: Arta Silence, MD;  Location: [facility] ENDOSCOPY;  Service: Endoscopy;  Laterality: N/A;  . TONSILLECTOMY AND ADENOIDECTOMY      Current Outpatient Medications:  .  amoxicillin (AMOXIL) 500 MG tablet, Take 500 mg by mouth as needed. Prior to dental appointments, Disp: , Rfl:  .  estradiol (VIVELLE-DOT) 0.1 MG/24HR patch, Place 1 patch onto the skin 2 (two) times a week., Disp: , Rfl:  .  levothyroxine (SYNTHROID) 75 MCG tablet, Take 1 tablet (75 mcg total) by mouth daily., Disp: 90 tablet, Rfl: 3  Allergies  Allergen Reactions  . Other     Other reaction(s): unknown  . Amoxicillin-Pot Clavulanate Diarrhea     muscle pain, can tolerate short durations of amoxicillin  Has patient had a PCN reaction causing  immediate rash, facial/tongue/throat swelling, SOB or lightheadedness with hypotension: No Has patient had a PCN reaction causing severe rash involving mucus membranes or skin necrosis: No Has patient had a PCN reaction that required hospitalization: Unknown Has patient had a PCN reaction occurring within the last 10 years: Unknown   . Azithromycin Hives  . Levofloxacin Other (See Comments)    REACTION: itchy skin and bloody stools  . Naproxen Sodium Rash  . Sulfonamide Derivatives Other (See Comments)    combative   Review of Systems Objective:  There were no vitals filed for this visit.  General: Well developed, nourished, in no acute distress, alert and oriented x3   Dermatological: Skin is warm, dry and supple bilateral. Nails x 10 are well maintained; remaining integument appears unremarkable at this time. There are no open sores, no preulcerative lesions, no rash or signs of infection present.  Reactive hyper keratoma beneath the fourth metatarsal head of the left foot.  Vascular: Dorsalis Pedis artery and Posterior Tibial artery pedal pulses are 2/4 bilateral with immedate capillary fill time. Pedal hair growth present. No varicosities and no lower extremity edema present bilateral.   Neruologic: Grossly intact via light touch bilateral. Vibratory intact via tuning fork bilateral. Protective threshold with Semmes Wienstein monofilament intact to all pedal sites bilateral. Patellar and Achilles deep tendon reflexes 2+ bilateral. No Babinski or clonus noted bilateral.   Musculoskeletal: No gross boney pedal deformities  bilateral. No pain, crepitus, or limitation noted with foot and ankle range of motion bilateral. Muscular strength 5/5 in all groups tested bilateral.  Pain on palpation fourth metatarsal head most likely associated with keratoma this present.  Gait: Unassisted, Nonantalgic.    Radiographs:  None taken  Assessment & Plan:   Assessment: Debrided reactive  hyperkeratotic lesion today placed salicylic acid under occlusion to be washed off 3 days from now thoroughly.  She will follow-up with me in 6 weeks for similar treatment.  Plan: Legrand Como destruction of lesion follow-up with me in 6 weeks     Reade Trefz T. [place], [place]

## 2020-10-15 ENCOUNTER — Telehealth: Payer: Self-pay | Admitting: Podiatry

## 2020-10-15 NOTE — Telephone Encounter (Signed)
Pt called and would like to know how long she needs to wear her bandage, and how to take it off. She also stated her left foot was still tender and wanted to know if that was normal? Please advise.

## 2020-10-16 NOTE — Telephone Encounter (Signed)
Advised she could go ahead and remove bandage and wash off any excess salicylic acid that was there.

## 2020-10-27 ENCOUNTER — Ambulatory Visit: Payer: Medicare HMO

## 2020-11-02 ENCOUNTER — Ambulatory Visit: Payer: Medicare HMO | Attending: Internal Medicine

## 2020-11-02 DIAGNOSIS — Z23 Encounter for immunization: Secondary | ICD-10-CM

## 2020-11-02 NOTE — Progress Notes (Signed)
   Covid-19 Vaccination Clinic  Name:  Katelyn Katelyn    MRN: [id number] DOB: [birth year]  11/02/2020  [name] was observed post Covid-19 immunization for 15 minutes without incident. She was provided with Vaccine Information Sheet and instruction to access the V-Safe system.   [name] was instructed to call 911 with any severe reactions post vaccine: Marland Kitchen Difficulty breathing  . Swelling of face and throat  . A fast heartbeat  . A bad rash all over body  . Dizziness and weakness   Immunizations Administered    Name Date Dose VIS Date Route   PFIZER Comrnaty(Gray TOP) Covid-19 Vaccine 11/02/2020 10:48 AM 0.3 mL 07/02/2020 Intramuscular   Manufacturer: [place]   Lot: [id number]   [place]: [phone]

## 2020-11-04 ENCOUNTER — Telehealth: Payer: Self-pay | Admitting: *Deleted

## 2020-11-04 NOTE — Telephone Encounter (Signed)
Pt called requesting information related to resources for patients that would like to arrange transportation to and from appts? she does not want to take uber or lyft; the pt says she is scheduled for a dental procedure on 11/20/20 with Dr Westley Chandler [facility]; pt  given number to main hospital for recommendations; pt also encouraged to contact her provider to discuss; she verbalized understanding.

## 2020-11-06 ENCOUNTER — Other Ambulatory Visit (HOSPITAL_BASED_OUTPATIENT_CLINIC_OR_DEPARTMENT_OTHER): Payer: Self-pay

## 2020-11-06 MED ORDER — PFIZER-BIONT COVID-19 VAC-TRIS 30 MCG/0.3ML IM SUSP
INTRAMUSCULAR | 0 refills | Status: DC
  Filled 2020-11-06: qty 0.3, 1d supply, fill #0

## 2020-11-09 ENCOUNTER — Telehealth: Payer: Self-pay | Admitting: *Deleted

## 2020-11-09 NOTE — Telephone Encounter (Signed)
Patient is still having pain on bottom of left foot, sore, tender , like walking on coals. Is there anything that can be done? (last appointment 10/08/20),please schedule f/u appointment

## 2020-11-10 NOTE — Telephone Encounter (Signed)
Lvm for patient to call and schedule an appointment with our office

## 2020-11-10 NOTE — Telephone Encounter (Signed)
Patient returning call back, please schedule appointment for left sore and tender bottom of foot.

## 2020-11-20 DIAGNOSIS — M7742 Metatarsalgia, left foot: Secondary | ICD-10-CM | POA: Diagnosis not present

## 2020-12-04 ENCOUNTER — Other Ambulatory Visit: Payer: Self-pay

## 2020-12-04 ENCOUNTER — Ambulatory Visit: Payer: Medicare HMO | Admitting: Podiatry

## 2020-12-04 DIAGNOSIS — Q828 Other specified congenital malformations of skin: Secondary | ICD-10-CM | POA: Diagnosis not present

## 2020-12-07 NOTE — Progress Notes (Signed)
Subjective:   Patient ID: Katelyn [place], female   DOB: 75 y.o.   MRN: [id number]   HPI Patient states she has a painful lesion on the bottom of her left foot that she has not been able to get better and she had medication put on last time and it seemed like that irritated   ROS      Objective:  Physical Exam  Neurovascular status intact with intense keratotic lesion of the subleft fourth metatarsal that is painful when pressed     Assessment:  Chronic porokeratotic lesion formation left     Plan:  Reviewed condition recommended aggressive conservative treatment sterile debridement accomplished no iatrogenic bleeding padding applied reappoint if symptoms persist

## 2020-12-14 ENCOUNTER — Encounter: Payer: Self-pay | Admitting: Podiatry

## 2020-12-17 ENCOUNTER — Ambulatory Visit: Payer: Medicare HMO | Admitting: Podiatry

## 2020-12-17 ENCOUNTER — Other Ambulatory Visit: Payer: Medicare HMO

## 2020-12-22 ENCOUNTER — Ambulatory Visit (INDEPENDENT_AMBULATORY_CARE_PROVIDER_SITE_OTHER): Payer: Medicare HMO | Admitting: Internal Medicine

## 2020-12-22 ENCOUNTER — Other Ambulatory Visit: Payer: Self-pay

## 2020-12-22 ENCOUNTER — Encounter: Payer: Self-pay | Admitting: Internal Medicine

## 2020-12-22 VITALS — BP 148/72 | HR 70 | Ht 61.0 in | Wt 88.4 lb

## 2020-12-22 DIAGNOSIS — E039 Hypothyroidism, unspecified: Secondary | ICD-10-CM

## 2020-12-22 NOTE — Progress Notes (Signed)
Name: Katelyn Anthony  MRN/ DOB: [id number], 22-Apr-1946    Age/ Sex: 75 y.o., female     PCP: Maurice Small, MD   Reason for Endocrinology Evaluation: Hypothyroidism     Initial Endocrinology Clinic Visit: 11/26/2019    PATIENT IDENTIFIER: Katelyn Anthony is a 75 y.o., female with a past medical history of  hypothyroidism, dyslipidemia and MVP. She has followed with [facility] since 11/26/2019  for consultative assistance with management of her Hypothyroidism.   HISTORICAL SUMMARY:  She has been diagnosed with hypothyroidism since 1990'. She uses brand only .   SUBJECTIVE:     Today (12/22/2020):  Katelyn [place] is here for hypothyroidism. She has not been here in almost a year.   She denies weight loss Denies loose stools or diarrhea  Denies palpitation or tremors.   Has noted occasional local neck swelling since COVID vaccine in 10/2020   She is requesting B12 and D check as she had them high in the past and was told to stop supplements, does not recall when that as     Levothyroxine 75 mcg , HALF a tablet on Sundays and 1 tablet the rest of the week but has been taking 1 tablet daily      HISTORY:  Past Medical History:  Past Medical History:  Diagnosis Date  . Allergy    feathers  . Cataract   . Cholelithiasis with cholecystitis    Chronic  . Hypothyroidism   . MVP (mitral valve prolapse)   . Osteopenia   . Pneumonia   . Thyroid disease    Past Surgical History:  Past Surgical History:  Procedure Laterality Date  . ABDOMINAL HYSTERECTOMY    . APPENDECTOMY    . CHOLECYSTECTOMY N/A 01/10/2018   Procedure: LAPAROSCOPIC CHOLECYSTECTOMY WITH INTRAOPERATIVE CHOLANGIOGRAM;  Surgeon: Excell Seltzer, MD;  Location: [place];  Service: General;  Laterality: N/A;  . COLON SURGERY     diverticulitis  . COLONOSCOPY WITH PROPOFOL N/A 03/09/2016   Procedure: COLONOSCOPY WITH PROPOFOL;  Surgeon: Arta Silence, MD;  Location: [facility] ENDOSCOPY;  Service:  Endoscopy;  Laterality: N/A;  . TONSILLECTOMY AND ADENOIDECTOMY      Social History:  reports that she has quit smoking. Her smoking use included cigarettes. She has never used smokeless tobacco. She reports current alcohol use of about 2.0 standard drinks of alcohol per week. She reports that she does not use drugs. Family History:  Family History  Problem Relation Age of Onset  . Colon cancer Mother      HOME MEDICATIONS: Allergies as of 12/22/2020      Reactions   Other    Other reaction(s): unknown   Amoxicillin-pot Clavulanate Diarrhea    muscle pain, can tolerate short durations of amoxicillin  Has patient had a PCN reaction causing immediate rash, facial/tongue/throat swelling, SOB or lightheadedness with hypotension: No Has patient had a PCN reaction causing severe rash involving mucus membranes or skin necrosis: No Has patient had a PCN reaction that required hospitalization: Unknown Has patient had a PCN reaction occurring within the last 10 years: Unknown   Azithromycin Hives   Levofloxacin Other (See Comments)   REACTION: itchy skin and bloody stools   Naproxen Sodium Rash   Sulfonamide Derivatives Other (See Comments)   combative      Medication List       Accurate as of Dec 22, 2020  3:00 PM. If you have any questions, ask your nurse or doctor.  amoxicillin 500 MG tablet Commonly known as: AMOXIL Take 500 mg by mouth as needed. Prior to dental appointments   estradiol 0.1 MG/24HR patch Commonly known as: VIVELLE-DOT Place 1 patch onto the skin once a week.   levothyroxine 75 MCG tablet Commonly known as: SYNTHROID Take 1 tablet (75 mcg total) by mouth daily.   Pfizer-BioNTech COVID-19 Vacc 30 MCG/0.3ML injection Generic drug: COVID-19 mRNA vaccine (Pfizer) INJECT AS DIRECTED   Pfizer-BioNT COVID-19 Vac-TriS Susp injection Generic drug: COVID-19 mRNA Vac-TriS (Pfizer) Inject into the muscle.         OBJECTIVE:   PHYSICAL EXAM: VS: BP  (!) 148/72   Pulse 70   Ht 5\' 1"  (1.549 m)   Wt 88 lb 6 oz (40.1 kg)   SpO2 98%   BMI 16.70 kg/m    EXAM: General: Pt appears well and is in NAD  Neck: General: Supple without adenopathy. Thyroid: Thyroid size normal.  No goiter or nodules appreciated. No thyroid bruit.  Lungs: Clear with good BS bilat with no rales, rhonchi, or wheezes  Heart: Auscultation: RRR.  Abdomen: Normoactive bowel sounds, soft, nontender, without masses or organomegaly palpable  Extremities:  BL LE: No pretibial edema normal ROM and strength.  Mental Status: Judgment, insight: Intact Orientation: Oriented to time, place, and person Mood and affect: No depression, anxiety, or agitation     DATA REVIEWED: Results for AZELYN, BATIE (MRN [id number]) as of 12/24/2020 14:36  Ref. Range 12/22/2020 14:32  VITD Latest Ref Range: 30.00 - 100.00 ng/mL 34.37  Vitamin B12 Latest Ref Range: 211 - 911 pg/mL 574  TSH Latest Ref Range: 0.35 - 4.50 uIU/mL 0.31 (L)      ASSESSMENT / PLAN / RECOMMENDATIONS:   1. Hypothyroidism :   - She is clinically euthyroid  - She has not been here in a year . Back in 01/2020 she was instructed to reduce her levothyroxine dose due to low TSh but somehow she missed that message and continued to take levothyroxine daily  - TSH today continues to be low , will reduce the dose    Medications   Decrease Levothyroxine 75 mcg , HALF a tablet on Sundays and 1 tablet the rest of the week    Labs in 8 weeks  F/U in 6 months    Signed electronically by: Mack Guise, MD  [facility]  [address] [place], [place] [address] Phone: [phone] FAX: [phone]      CC: Maurice Small, MD [address] [place] [address] Phone: [phone]  Fax: [phone]   Return to Endocrinology clinic as below: Future Appointments  Date Time Provider [place]  02/16/2021  3:30 PM [facility]  [facility]  06/22/2021  3:40 PM Irini Leet, Melanie Crazier, MD [facility] [place]

## 2020-12-22 NOTE — Patient Instructions (Signed)
You are on Synthroid- which is your thyroid hormone supplement. You MUST take this consistently.  You should take this first thing in the morning on an empty stomach with water. You should not take it with other medications. Wait 30min to 1hr prior to eating. If you are taking any vitamins - please take these in the evening.   If you miss a dose, please take your missed dose the following day (double the dose for that day). You should have a pill box for ONLY levothyroxine on your bedside table to help you remember to take your medications.  

## 2020-12-23 LAB — VITAMIN D 25 HYDROXY (VIT D DEFICIENCY, FRACTURES): VITD: 34.37 ng/mL (ref 30.00–100.00)

## 2020-12-23 LAB — TSH: TSH: 0.31 u[IU]/mL — ABNORMAL LOW (ref 0.35–4.50)

## 2020-12-23 LAB — VITAMIN B12: Vitamin B-12: 574 pg/mL (ref 211–911)

## 2020-12-24 ENCOUNTER — Telehealth: Payer: Self-pay | Admitting: Internal Medicine

## 2020-12-24 ENCOUNTER — Encounter: Payer: Self-pay | Admitting: Internal Medicine

## 2020-12-24 MED ORDER — LEVOTHYROXINE SODIUM 75 MCG PO TABS: 75.0000 ug | ORAL_TABLET | ORAL | 1 refills | Status: DC

## 2020-12-24 NOTE — Telephone Encounter (Signed)
New message    Patient has questions regarding current labs results & medication  At home until noon will be unavailable until tomorrow.

## 2020-12-28 ENCOUNTER — Ambulatory Visit (INDEPENDENT_AMBULATORY_CARE_PROVIDER_SITE_OTHER): Payer: Medicare HMO

## 2020-12-28 ENCOUNTER — Other Ambulatory Visit: Payer: Self-pay

## 2020-12-28 ENCOUNTER — Ambulatory Visit: Payer: Medicare HMO | Admitting: Podiatry

## 2020-12-28 ENCOUNTER — Encounter: Payer: Self-pay | Admitting: Podiatry

## 2020-12-28 DIAGNOSIS — M778 Other enthesopathies, not elsewhere classified: Secondary | ICD-10-CM

## 2020-12-28 DIAGNOSIS — H524 Presbyopia: Secondary | ICD-10-CM | POA: Diagnosis not present

## 2020-12-28 DIAGNOSIS — M216X9 Other acquired deformities of unspecified foot: Secondary | ICD-10-CM | POA: Diagnosis not present

## 2020-12-28 DIAGNOSIS — Z0100 Encounter for examination of eyes and vision without abnormal findings: Secondary | ICD-10-CM | POA: Diagnosis not present

## 2020-12-28 DIAGNOSIS — Q828 Other specified congenital malformations of skin: Secondary | ICD-10-CM

## 2020-12-28 MED ORDER — TRIAMCINOLONE ACETONIDE 10 MG/ML IJ SUSP
10.0000 mg | Freq: Once | INTRAMUSCULAR | Status: AC
  Administered 2020-12-28: 10 mg

## 2020-12-29 NOTE — Progress Notes (Signed)
Subjective:   Patient ID: Katelyn [place], female   DOB: 75 y.o.   MRN: [id number]   HPI Patient presents with exquisite tenderness underneath the left foot and stated that the previous injection has not helped her and it seems like it is getting worse and making it harder to walk   ROS      Objective:  Physical Exam  Patient has combination of several things with 1 being a deep porokeratotic lesion and the second being apparent inflammatory capsulitis and a plantarflexed metatarsal around the fourth MPJ     Assessment:  Inflammatory capsulitis with a structural deformity and keratotic lesion formation     Plan:  H&P discussed the possibility for surgery for this condition I will get a try injection with debridement and if this does not work we will get a need to consider surgical intervention with lifting the metatarsal.  Patient at this time had sterile prep I injected around the fourth MPJ 3 mg Dexasone Kenalog 5 mg Xylocaine I debrided the lesion applied padding and we will see back when symptomatic and ultimately may require surgical intervention  X-rays indicate the lesion is for the most part around the third and fourth metatarsal with plantar flexion noted

## 2020-12-30 NOTE — Telephone Encounter (Signed)
Patient viewed results and Dr Quin Hoop comments on MyChart

## 2021-01-06 ENCOUNTER — Telehealth: Payer: Self-pay | Admitting: *Deleted

## 2021-01-06 NOTE — Telephone Encounter (Addendum)
Patient calling to request some advise on the soreness on the bottom of her foot. She was given an injection between Feb.-March,felt better for a little while.She is now requesting another injection, or referral to an athletic specialist. She is not interested in surgery unless it's a guarantee it will solve the problem. Please advise.

## 2021-01-07 NOTE — Telephone Encounter (Signed)
Returned call and gave information per Dr Paulla Dolly, verbalized understanding and will schedule an upcoming appointment.

## 2021-01-07 NOTE — Telephone Encounter (Signed)
She can have another injection and we can talk about other options

## 2021-01-18 ENCOUNTER — Ambulatory Visit: Payer: Medicare HMO | Admitting: Podiatry

## 2021-01-18 ENCOUNTER — Other Ambulatory Visit: Payer: Self-pay

## 2021-01-18 ENCOUNTER — Encounter: Payer: Self-pay | Admitting: Podiatry

## 2021-01-18 DIAGNOSIS — M778 Other enthesopathies, not elsewhere classified: Secondary | ICD-10-CM | POA: Diagnosis not present

## 2021-01-18 MED ORDER — TRIAMCINOLONE ACETONIDE 10 MG/ML IJ SUSP
10.0000 mg | Freq: Once | INTRAMUSCULAR | Status: AC
  Administered 2021-01-18: 10 mg

## 2021-01-18 NOTE — Progress Notes (Signed)
Subjective:   Patient ID: Katelyn [place], female   DOB: 75 y.o.   MRN: [id number]   HPI Patient presents stating that her left foot is still sore and states the lesion that we worked on is better but the joints themselves are still tender   ROS      Objective:  Physical Exam  Neurovascular status intact with patient's left fourth MPJ actually moderately improved with quite a bit of pain around the third MPJ left     Assessment:  Inflammatory capsulitis still present with improvement but pain with patient still bearing significant weight on the foot     Plan:  Reviewed condition and went ahead did sterile prep and injected around the third MPJ periarticular 3 mg Dexasone Kenalog 5 mg Xylocaine and went ahead dispensed air fracture walker to create rigid immobilization of the plantar left foot.  Patient will be seen back to recheck is encouraged to call and still may ultimately require surgery but hopefully we can avoid this with improvement in condition

## 2021-01-20 ENCOUNTER — Telehealth: Payer: Self-pay | Admitting: *Deleted

## 2021-01-20 NOTE — Telephone Encounter (Signed)
Patient is not happy with her boot she was given on 01/18/21. She is requesting something better to wear,is on her feet  all day. Please advise.

## 2021-01-20 NOTE — Telephone Encounter (Signed)
There is no other way to reduce the weight bearing pressure on her foot. Best that we have

## 2021-01-21 ENCOUNTER — Other Ambulatory Visit: Payer: Self-pay

## 2021-01-21 ENCOUNTER — Emergency Department (HOSPITAL_COMMUNITY)
Admission: EM | Admit: 2021-01-21 | Discharge: 2021-01-21 | Disposition: A | Discharge status: Home / Self Care | Payer: Medicare HMO | Attending: Emergency Medicine | Admitting: Emergency Medicine

## 2021-01-21 ENCOUNTER — Emergency Department (HOSPITAL_COMMUNITY): Payer: Medicare HMO

## 2021-01-21 DIAGNOSIS — M79672 Pain in left foot: Secondary | ICD-10-CM | POA: Diagnosis not present

## 2021-01-21 DIAGNOSIS — E039 Hypothyroidism, unspecified: Secondary | ICD-10-CM | POA: Insufficient documentation

## 2021-01-21 DIAGNOSIS — Z87891 Personal history of nicotine dependence: Secondary | ICD-10-CM | POA: Diagnosis not present

## 2021-01-21 DIAGNOSIS — Z79899 Other long term (current) drug therapy: Secondary | ICD-10-CM | POA: Insufficient documentation

## 2021-01-21 DIAGNOSIS — S99922A Unspecified injury of left foot, initial encounter: Secondary | ICD-10-CM | POA: Diagnosis not present

## 2021-01-21 LAB — CBC WITH DIFFERENTIAL/PLATELET
Abs Immature Granulocytes: 0.03 10*3/uL (ref 0.00–0.07)
Basophils Absolute: 0.1 10*3/uL (ref 0.0–0.1)
Basophils Relative: 1 %
Eosinophils Absolute: 0.1 10*3/uL (ref 0.0–0.5)
Eosinophils Relative: 2 %
HCT: 41.4 % (ref 36.0–46.0)
Hemoglobin: 13.9 g/dL (ref 12.0–15.0)
Immature Granulocytes: 0 %
Lymphocytes Relative: 31 %
Lymphs Abs: 2.3 10*3/uL (ref 0.7–4.0)
MCH: 31.2 pg (ref 26.0–34.0)
MCHC: 33.6 g/dL (ref 30.0–36.0)
MCV: 92.8 fL (ref 80.0–100.0)
Monocytes Absolute: 0.8 10*3/uL (ref 0.1–1.0)
Monocytes Relative: 11 %
Neutro Abs: 4.2 10*3/uL (ref 1.7–7.7)
Neutrophils Relative %: 55 %
Platelets: 313 10*3/uL (ref 150–400)
RBC: 4.46 MIL/uL (ref 3.87–5.11)
RDW: 12.7 % (ref 11.5–15.5)
WBC: 7.5 10*3/uL (ref 4.0–10.5)
nRBC: 0 % (ref 0.0–0.2)

## 2021-01-21 LAB — COMPREHENSIVE METABOLIC PANEL
ALT: 22 U/L (ref 0–44)
AST: 31 U/L (ref 15–41)
Albumin: 4.4 g/dL (ref 3.5–5.0)
Alkaline Phosphatase: 69 U/L (ref 38–126)
Anion gap: 8 (ref 5–15)
BUN: 7 mg/dL — ABNORMAL LOW (ref 8–23)
CO2: 25 mmol/L (ref 22–32)
Calcium: 9.5 mg/dL (ref 8.9–10.3)
Chloride: 104 mmol/L (ref 98–111)
Creatinine, Ser: 0.72 mg/dL (ref 0.44–1.00)
GFR, Estimated: >60 mL/min (ref >60)
Glucose, Bld: 93 mg/dL (ref 70–99)
Potassium: 3.7 mmol/L (ref 3.5–5.1)
Sodium: 137 mmol/L (ref 135–145)
Total Bilirubin: 0.8 mg/dL (ref 0.3–1.2)
Total Protein: 6.9 g/dL (ref 6.5–8.1)

## 2021-01-21 MED ORDER — MELOXICAM 7.5 MG PO TABS: 7.5000 mg | ORAL_TABLET | Freq: Every day | ORAL | 0 refills | Status: DC

## 2021-01-21 NOTE — ED Provider Notes (Signed)
Emergency Medicine Provider Triage Evaluation Note  Katelyn [place] , a 75 y.o. female  was evaluated in triage.  Pt complains of pain to left foot.  Patient reports that she has had pain in this area since she had a corn removed in March.  Patient reports that she had cortisone shot in May which improved her symptoms for 4 days.  However came back.  Patient had cortisone shot next Monday and states that pain has been worse since then.  Patient denies any numbness or weakness to affected extremity.  Patient noted to be hypertensive in the emergency department.  States that her blood pressure To thyroid disease and her endocrinologist is working to find the correct dose of her Synthyroid.  Patient denies any headache, visual disturbance, chest pain, shortness of breath, numbness, weakness, slurred speech, facial asymmetry.  Patient does not take any medications for  Review of Systems  Positive: Left foot pain, Negative: headache, visual disturbance, chest pain, shortness of breath, numbness, weakness, slurred speech, facial asymmetry  Physical Exam  BP (!) 196/94 (BP Location: Left Arm)   Pulse 78   Temp 99.1 F (37.3 C) (Oral)   Resp 18   SpO2 100%  Gen:   Awake, no distress   Resp:  Normal effort  MSK:   Moves extremities without difficulty  Other:  + Strength to dorsiflexion and plantarflexion of the left ankle.  Patient has full range of motion of all digits to left toe.  Sensation intact to all digits of left toe.  Patient has callus to sole of left foot, tenderness around this callus.  No swelling, rash, or erythema noted.  Dorsalis pedis pulse to left foot  Medical Decision Making  Medically screening exam initiated at 5:13 PM.  Appropriate orders placed.  Katelyn [place] was informed that the remainder of the evaluation will be completed by another provider, this initial triage assessment does not replace that evaluation, and the importance of remaining in the ED until their evaluation is  complete.  The patient appears stable so that the remainder of the work up may be completed by another provider.      Katelyn Beckwith, PA-C 01/21/21 [place], [place], DO 01/21/21 1905

## 2021-01-21 NOTE — ED Provider Notes (Signed)
[facility] EMERGENCY DEPARTMENT Provider Note   CSN: [id number] Arrival date & time: 01/21/21  1614     History Chief Complaint  Patient presents with   Foot Pain    Katelyn Anthony is a 75 y.o. female.  75 year old female with past medical history below who presents with left foot pain.  Patient states that she has followed with a podiatrist for a long time for calluses on her feet.  In March of this year, she had a callus and corn on her left foot that were bothering her and she was unable to get in with her usual podiatrist so she was seen at another podiatry clinic.  There they could shaved her callus and applied salicylic acid and she states that ever since she was given that medication, she has had burning pain on her plantar foot which she states feels like a sore throat on her foot.  She went back to the clinic and had a cortisone injection this week but her pain has continued to be severe especially when she walks.  No drainage.  She has also seen an orthopedic surgeon at 1 point for the same foot.  She was given a walking boot for this foot but states that it is so heavy that it makes her heel hurt when she walks.  The history is provided by the patient.  Foot Pain      Past Medical History:  Diagnosis Date   Allergy    feathers   Cataract    Cholelithiasis with cholecystitis    Chronic   Hypothyroidism    MVP (mitral valve prolapse)    Osteopenia    Pneumonia    Thyroid disease     Patient Active Problem List   Diagnosis Date Noted   Arthritis 10/08/2020   Chronic fatigue syndrome 10/08/2020   Constipation 10/08/2020   Family history of colonic polyps 10/08/2020   Hyperlipidemia 10/08/2020   Hypomagnesemia 10/08/2020   Menopause 10/08/2020   Mitral valve prolapse 10/08/2020   Osteoporosis 10/08/2020   Insomnia 10/08/2020   Vitamin D deficiency 10/08/2020   Metatarsalgia of left foot 08/25/2020   Acquired plantar porokeratosis 03/13/2020    Prominent metatarsal head of right foot 03/13/2020   Cholelithiasis and cholecystitis without obstruction 01/10/2018   Myopia with astigmatism and presbyopia, bilateral 05/10/2017   Corn of toe 12/07/2016   Multinodular goiter 08/01/2013   COLONIC POLYPS, HX OF 01/08/2010   PNEUMONIA 05/01/2009   DYSPNEA 05/01/2009   Nonspecific (abnormal) findings on radiological and other examination of body structure 05/01/2009   ABNORMAL CHEST XRAY 05/01/2009   Hypothyroidism 03/26/2009   MITRAL VALVE PROLAPSE 03/26/2009    Past Surgical History:  Procedure Laterality Date   ABDOMINAL HYSTERECTOMY     APPENDECTOMY     CHOLECYSTECTOMY N/A 01/10/2018   Procedure: LAPAROSCOPIC CHOLECYSTECTOMY WITH INTRAOPERATIVE CHOLANGIOGRAM;  Surgeon: Excell Seltzer, MD;  Location: [place];  Service: General;  Laterality: N/A;   COLON SURGERY     diverticulitis   COLONOSCOPY WITH PROPOFOL N/A 03/09/2016   Procedure: COLONOSCOPY WITH PROPOFOL;  Surgeon: Arta Silence, MD;  Location: [facility] ENDOSCOPY;  Service: Endoscopy;  Laterality: N/A;   TONSILLECTOMY AND ADENOIDECTOMY       OB History   No obstetric history on file.     Family History  Problem Relation Age of Onset   Colon cancer Mother     Social History   Tobacco Use   Smoking status: Former    Pack years:  0.00    Types: Cigarettes   Smokeless tobacco: Never  Vaping Use   Vaping Use: Never used  Substance Use Topics   Alcohol use: Yes    Alcohol/week: 2.0 standard drinks    Types: 2 Glasses of wine per week    Comment: 1.5- 2 glasses of wine daily   Drug use: No    Home Medications Prior to Admission medications   Medication Sig Start Date End Date Taking? Authorizing Provider  meloxicam (MOBIC) 7.5 MG tablet Take 1 tablet (7.5 mg total) by mouth daily. 01/21/21  Yes Rashell Shambaugh, Wenda Overland, MD  amoxicillin (AMOXIL) 500 MG tablet Take 500 mg by mouth as needed. Prior to dental appointments    [provider]  COVID-19 mRNA  Vac-TriS, Pfizer, (PFIZER-BIONT COVID-19 VAC-TRIS) SUSP injection Inject into the muscle. 11/02/20   Carlyle Basques, MD  COVID-19 mRNA vaccine, [place], 30 MCG/0.3ML injection INJECT AS DIRECTED 05/15/20 05/15/21  Carlyle Basques, MD  estradiol (VIVELLE-DOT) 0.1 MG/24HR patch Place 1 patch onto the skin once a week. 05/18/20   [provider]  levothyroxine (SYNTHROID) 75 MCG tablet Take 1 tablet (75 mcg total) by mouth as directed. 12/24/20   Shamleffer, Melanie Crazier, MD    Allergies    Other, Amoxicillin-pot clavulanate, Azithromycin, Levofloxacin, Naproxen sodium, and Sulfonamide derivatives  Review of Systems   Review of Systems  Constitutional:  Negative for fever.  Musculoskeletal:  Positive for gait problem. Negative for joint swelling.  Skin:  Negative for wound.  Neurological:  Negative for numbness.   Physical Exam Updated Vital Signs BP (!) 159/71 (BP Location: Left Arm)   Pulse 68   Temp 97.7 F (36.5 C) (Oral)   Resp 14   SpO2 100%   Physical Exam Vitals and nursing note reviewed.  Constitutional:      General: She is not in acute distress.    Appearance: She is well-developed.  HENT:     Head: Normocephalic and atraumatic.  Eyes:     Conjunctiva/sclera: Conjunctivae normal.  Musculoskeletal:        General: Tenderness present. No swelling or deformity. Normal range of motion.     Cervical back: Neck supple.       Feet:     Comments: Callus on plantar L foot with central lesion that resembles a plantar wart; no drainage, erythema, edema or fluctuance  Skin:    General: Skin is warm and dry.  Neurological:     Mental Status: She is alert and oriented to person, place, and time.  Psychiatric:        Judgment: Judgment normal.    ED Results / Procedures / Treatments   Labs (all labs ordered are listed, but only abnormal results are displayed) Labs Reviewed  COMPREHENSIVE METABOLIC PANEL - Abnormal; Notable for the following components:       Result Value   BUN 7 (*)    All other components within normal limits  CBC WITH DIFFERENTIAL/PLATELET    EKG None  Radiology DG Foot Complete Left  Result Date: 01/21/2021 CLINICAL DATA:  Foot injury EXAM: LEFT FOOT - COMPLETE 3+ VIEW COMPARISON:  None. FINDINGS: No fracture or malalignment. No radiopaque foreign body. Degenerative changes at the first MTP joint and TMT joint. IMPRESSION: No acute osseous abnormality Electronically Signed   By: Donavan Foil M.D.   On: 01/21/2021 17:57    Procedures Procedures   Medications Ordered in ED Medications - No data to display  ED Course  I have reviewed  the triage vital signs and the nursing notes.  Pertinent imaging results that were available during my care of the patient were reviewed by me and considered in my medical decision making (see chart for details).    MDM Rules/Calculators/A&P                          No signs of infection on exam.  Screening lab work unremarkable.  X-ray normal.  Will start on Mobic but explained that she would need definitive care with a specialist.  Encouraged her to follow-up with her primary podiatrist. Final Clinical Impression(s) / ED Diagnoses Final diagnoses:  Left foot pain    Rx / DC Orders ED Discharge Orders          Ordered    meloxicam (MOBIC) 7.5 MG tablet  Daily        01/21/21 2226             Zulma Court, Wenda Overland, MD 01/21/21 [phone]

## 2021-01-21 NOTE — ED Notes (Signed)
Pt had callus and corn removed back in March. Pt feels the tx caused the additional pain to her left heel where callus was removed. Upon appearance it is a small hard bump on left heel with a half dollar size red irritation. Pt had her last cortisone shot 6/27.

## 2021-01-21 NOTE — ED Triage Notes (Signed)
Pt c/o foot pain on L foot where she had corn removed 8/33/74, salicytic acid applied- pt believes that is issues. Cortisone shots in May & past Monday, no relief.  States pain is 10/10, "feels like sore throat on bottom of foot."

## 2021-01-26 NOTE — Telephone Encounter (Signed)
Called and left vmessage of Dr Mellody Drown recommendations.

## 2021-01-27 ENCOUNTER — Encounter: Payer: Self-pay | Admitting: Internal Medicine

## 2021-01-28 NOTE — Telephone Encounter (Signed)
Pt called the office to see if Dr.Shamleffer can help treat her low BUN levels? Pt states if not she was wondering if Dr.Shamleffer could refer her to someone to help treat this?  Can send a message on MyChart if you cant reach her on her phone.

## 2021-01-29 NOTE — Telephone Encounter (Signed)
LVM for pt to let her know that she would have to F/U with her PCP with the treatment for her BUN levels.

## 2021-02-01 DIAGNOSIS — M216X2 Other acquired deformities of left foot: Secondary | ICD-10-CM | POA: Insufficient documentation

## 2021-02-01 DIAGNOSIS — M7742 Metatarsalgia, left foot: Secondary | ICD-10-CM | POA: Diagnosis not present

## 2021-02-15 ENCOUNTER — Telehealth: Payer: Self-pay | Admitting: *Deleted

## 2021-02-15 NOTE — Telephone Encounter (Signed)
Returned the call to patient and informed that she may  return the boot.

## 2021-02-15 NOTE — Telephone Encounter (Signed)
Patient is calling and wants to return the boot given, only worn once, does not need and wants a credit given.Spoke with Vicki,said that it's at the physician's decretion.Please advise.

## 2021-02-15 NOTE — Telephone Encounter (Signed)
That's fine

## 2021-02-16 ENCOUNTER — Other Ambulatory Visit: Payer: Self-pay

## 2021-02-16 ENCOUNTER — Other Ambulatory Visit (INDEPENDENT_AMBULATORY_CARE_PROVIDER_SITE_OTHER): Payer: Medicare HMO

## 2021-02-16 DIAGNOSIS — Z681 Body mass index (BMI) 19 or less, adult: Secondary | ICD-10-CM | POA: Diagnosis not present

## 2021-02-16 DIAGNOSIS — Z01419 Encounter for gynecological examination (general) (routine) without abnormal findings: Secondary | ICD-10-CM | POA: Diagnosis not present

## 2021-02-16 DIAGNOSIS — R69 Illness, unspecified: Secondary | ICD-10-CM | POA: Diagnosis not present

## 2021-02-16 DIAGNOSIS — E039 Hypothyroidism, unspecified: Secondary | ICD-10-CM | POA: Diagnosis not present

## 2021-02-16 DIAGNOSIS — Z124 Encounter for screening for malignant neoplasm of cervix: Secondary | ICD-10-CM | POA: Diagnosis not present

## 2021-02-17 ENCOUNTER — Encounter: Payer: Self-pay | Admitting: Internal Medicine

## 2021-02-17 ENCOUNTER — Telehealth: Payer: Self-pay | Admitting: *Deleted

## 2021-02-17 LAB — TSH: TSH: 0.78 u[IU]/mL (ref 0.35–5.50)

## 2021-02-17 NOTE — Telephone Encounter (Signed)
Patient is calling for an antibiotic or a referral to a burn specialist . She is still having soreness on her left foot since March, feels like she is walking on fire. Please advise.

## 2021-02-18 NOTE — Telephone Encounter (Signed)
Returned call to patient to give recommendations per Dr Paulla Dolly, no answer, left vmessage.

## 2021-02-18 NOTE — Telephone Encounter (Signed)
No burn specialitst to see for this. She should try otc inserts that are cushioned

## 2021-02-22 ENCOUNTER — Telehealth: Payer: Self-pay | Admitting: Podiatry

## 2021-02-22 NOTE — Telephone Encounter (Signed)
Pt called stating sialic acid was placed on her foot during her appt with you and she has experienced pain in the area ever since. She would like to know if the pain will go away. She states it feels like she's walking on hot coal and her skin feels like "boiled or fried chicken". Please advise.

## 2021-03-02 ENCOUNTER — Telehealth: Payer: Self-pay | Admitting: *Deleted

## 2021-03-02 NOTE — Telephone Encounter (Signed)
Patient is calling because she is still having stinging and burning, icing is not helping.Please contact to schedule f/u per Dr Stephenie Acres note.

## 2021-03-03 NOTE — Telephone Encounter (Signed)
Returned call to patient,no answer, left vmessage giving recommendations per Dr Milinda Pointer.

## 2021-03-03 NOTE — Telephone Encounter (Signed)
Patient is calling back and she does not wish to schedule another appointment but would like something prescribed to get the sting out of the bottom of her foot. Please advise.

## 2021-03-09 ENCOUNTER — Telehealth: Payer: Self-pay | Admitting: *Deleted

## 2021-03-09 IMAGING — US ULTRASOUND LEFT BREAST LIMITED
1 series · 3 of 3 positions shown · non-contrast
Comparison: Previous exam(s).

CLINICAL DATA: Focal tenderness at the site of previous left breast
biopsy.

EXAM:
DIGITAL DIAGNOSTIC BILATERAL MAMMOGRAM WITH CAD AND TOMO
ULTRASOUND LEFT BREAST

[Series 1: ultrasound left breast limited · 0.06mm/px · 3 of 3 slices shown]
[im 1/3]
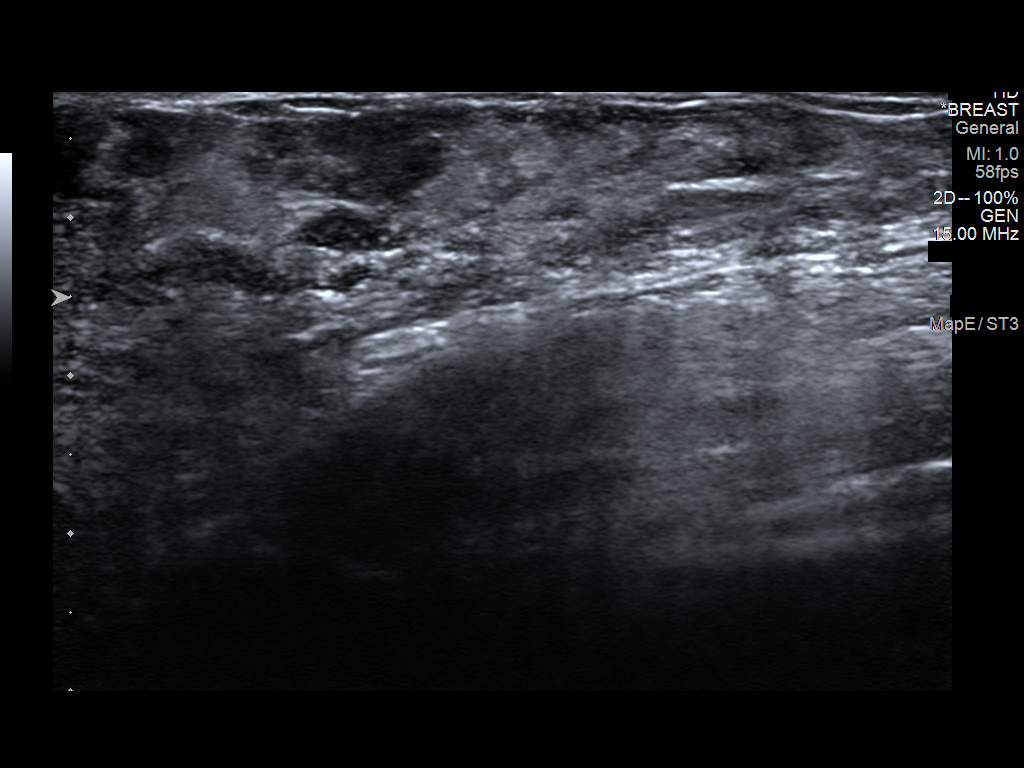
[im 2/3]
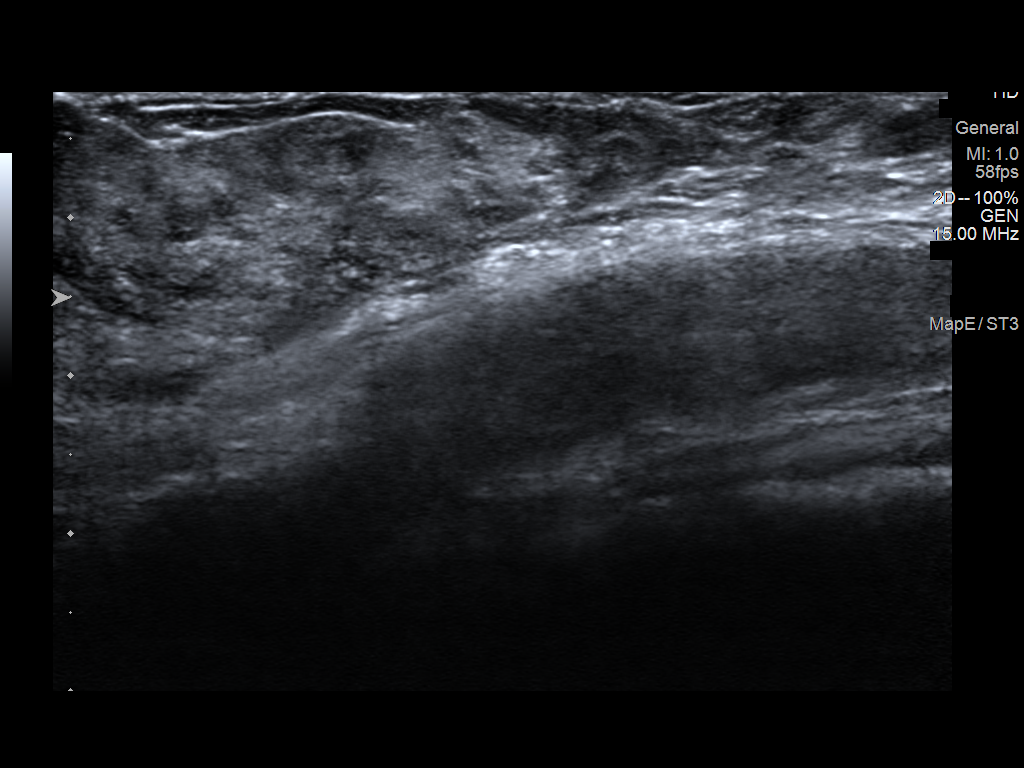
[im 3/3]
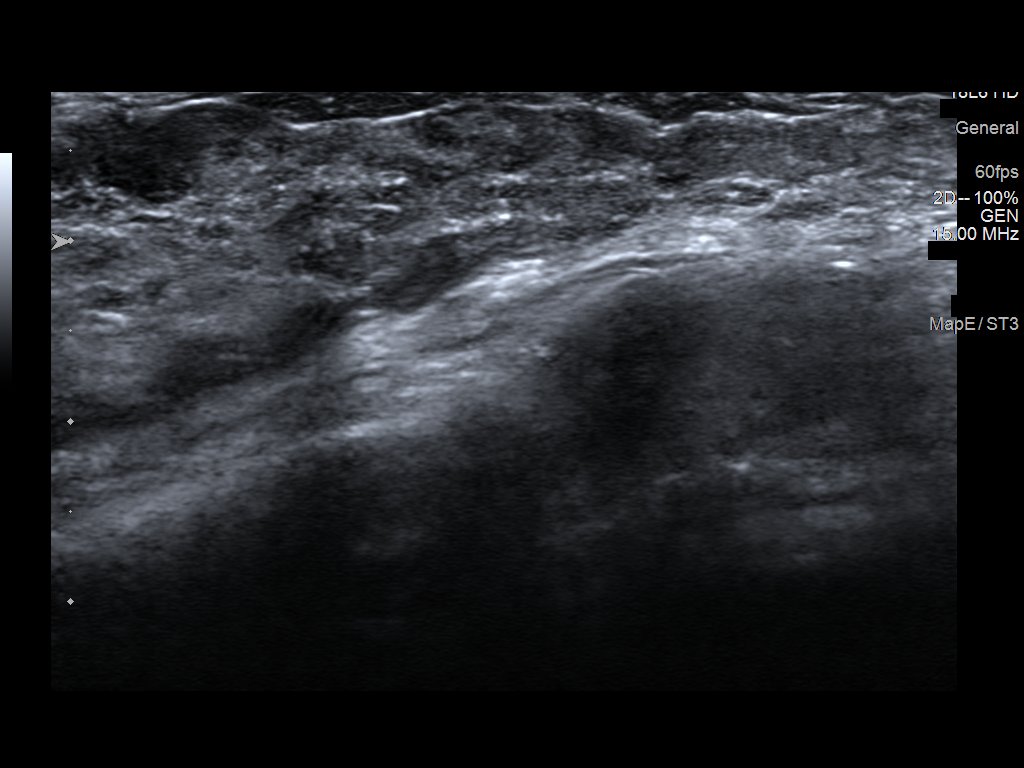

[3 of 3 positions shown; findings below may reference images not displayed]

ACR Breast Density Category d: The breast tissue is extremely dense,
which lowers the sensitivity of mammography.
FINDINGS: No suspicious masses, calcifications, or changes in either breast.

Mammographic images were processed with CAD.

On physical exam, no suspicious lumps are identified.

Targeted ultrasound is performed, showing no sonographic
abnormalities in the region of the patient's left breast tenderness.
IMPRESSION: No mammographic or sonographic evidence of malignancy.

RECOMMENDATION:
Treatment of the patient's symptoms should be based on clinical and
physical exam given lack of imaging findings. Annual screening
mammography.

I have discussed the findings and recommendations with the patient.
Results were also provided in writing at the conclusion of the
visit. If applicable, a reminder letter will be sent to the patient
regarding the next appointment.

BI-RADS CATEGORY  2: Benign.

## 2021-03-09 IMAGING — MG DIGITAL DIAGNOSTIC BILATERAL MAMMOGRAM WITH TOMO AND CAD
8 series · 8 of 24 positions shown · non-contrast
Comparison: Previous exam(s).

CLINICAL DATA: Focal tenderness at the site of previous left breast
biopsy.

EXAM:
DIGITAL DIAGNOSTIC BILATERAL MAMMOGRAM WITH CAD AND TOMO
ULTRASOUND LEFT BREAST

[R MLO synth-2D]
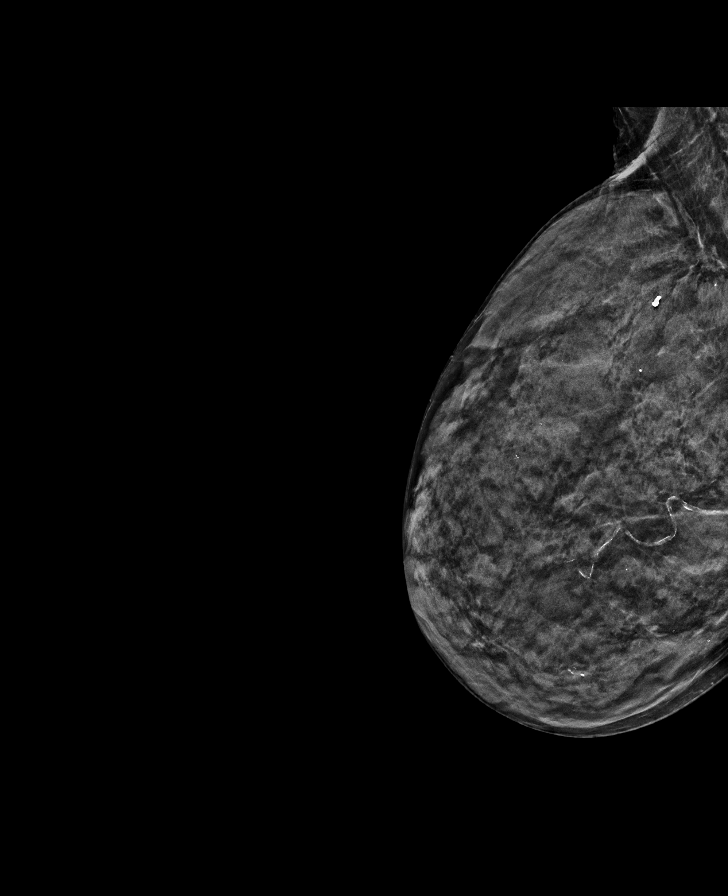

[L MLO synth-2D]
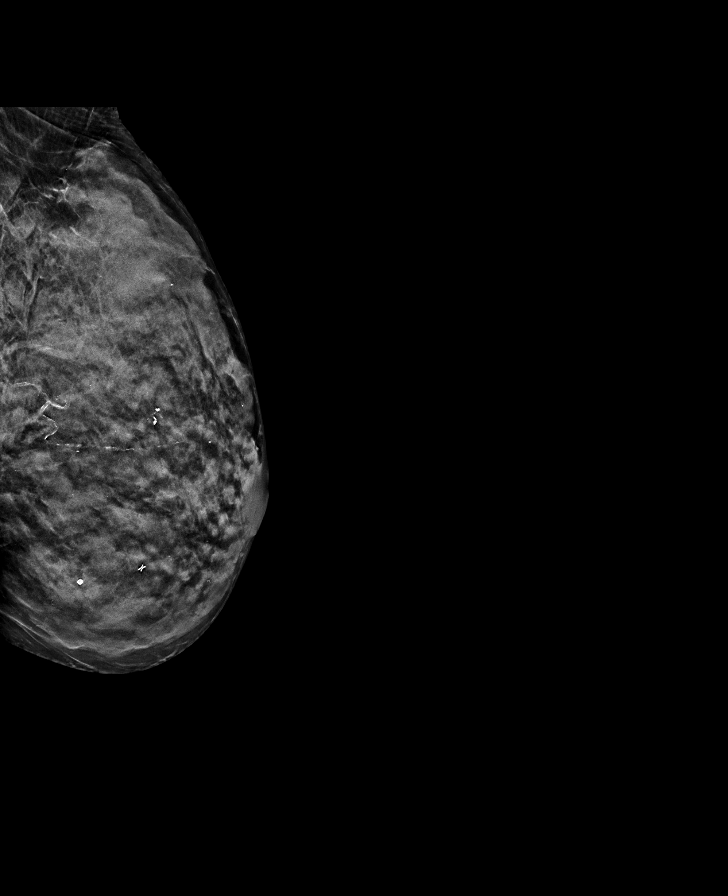

[L CC synth-2D]
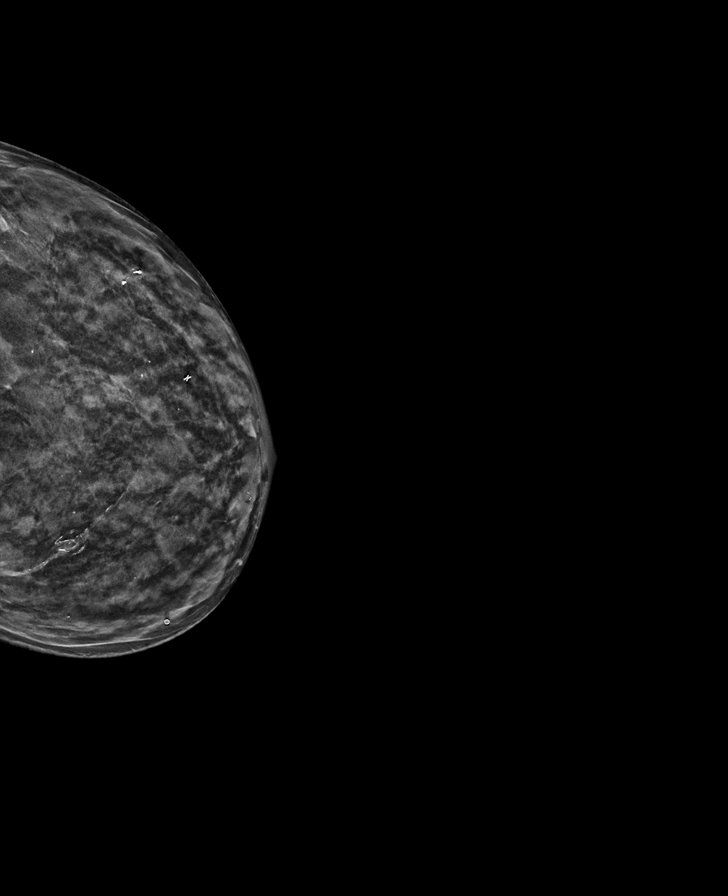

[R CC synth-2D]
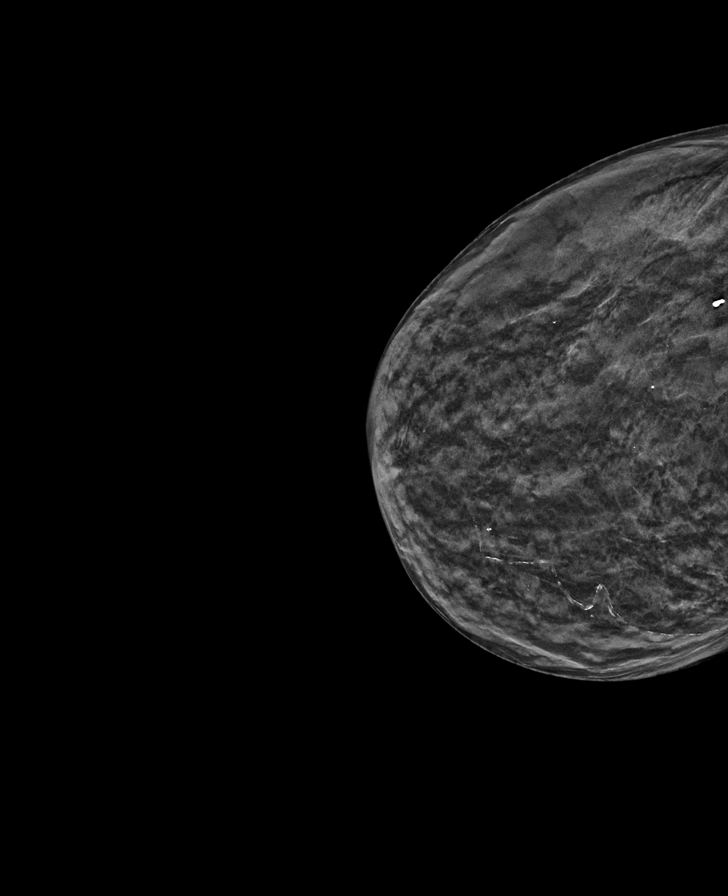

[L CC tomo · tomo slice 23/44.0]
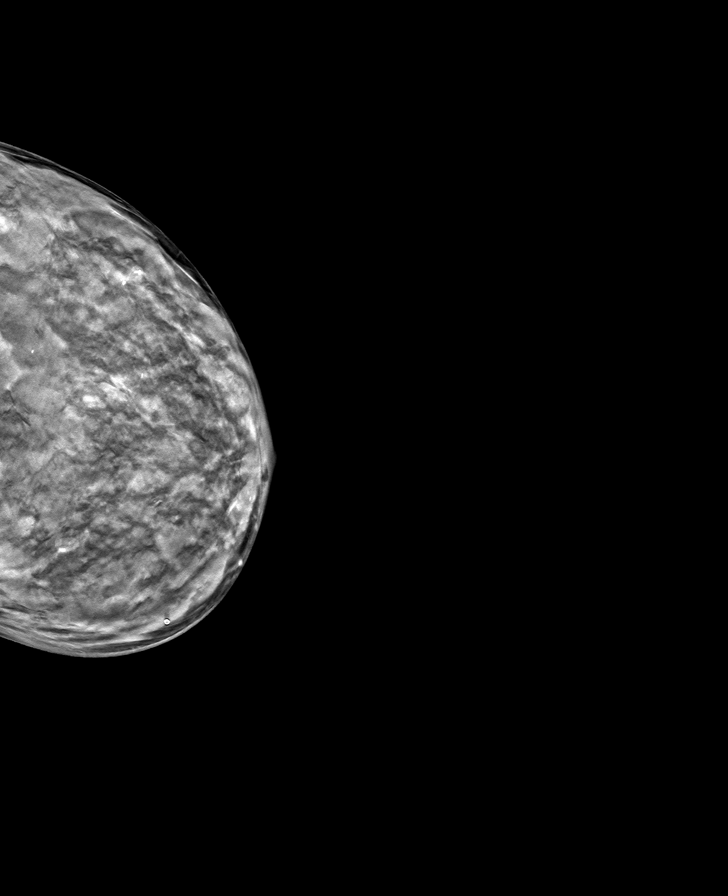

[R CC tomo · tomo slice 23/44.0]
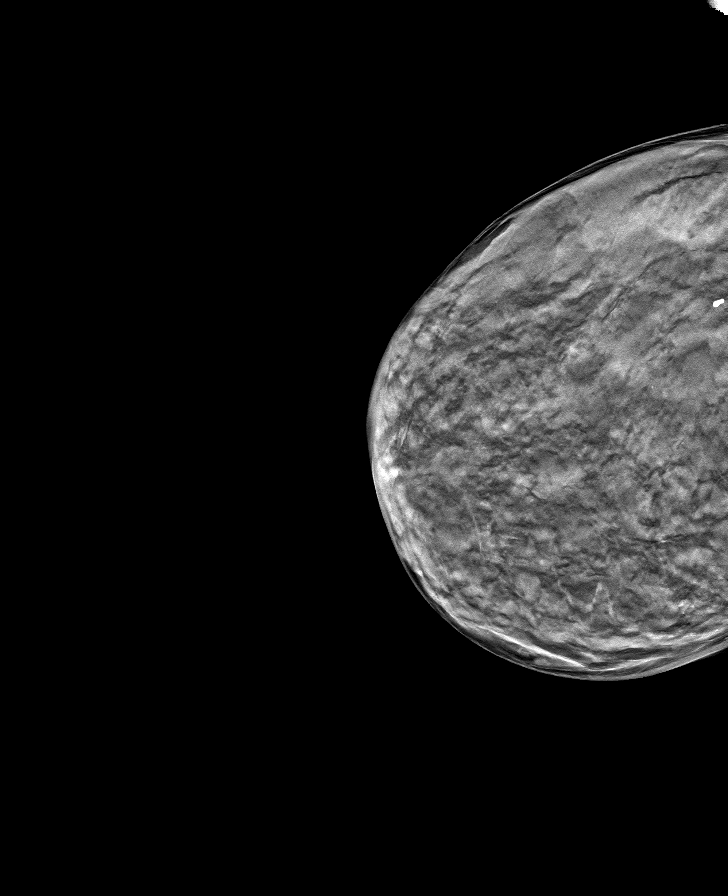

[R MLO tomo · tomo slice 23/45.0]
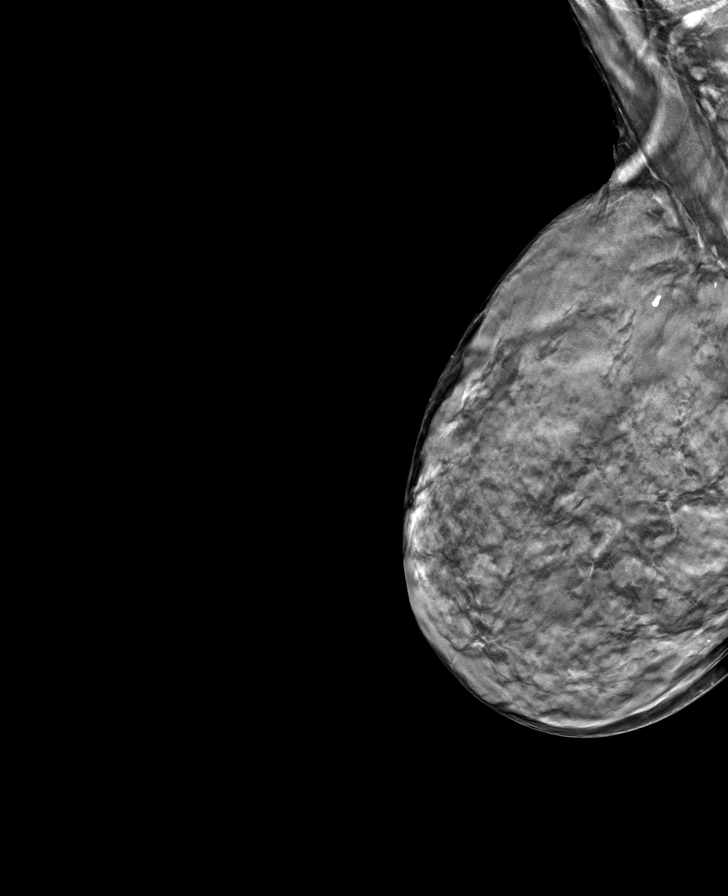

[L MLO tomo · tomo slice 25/48.0]
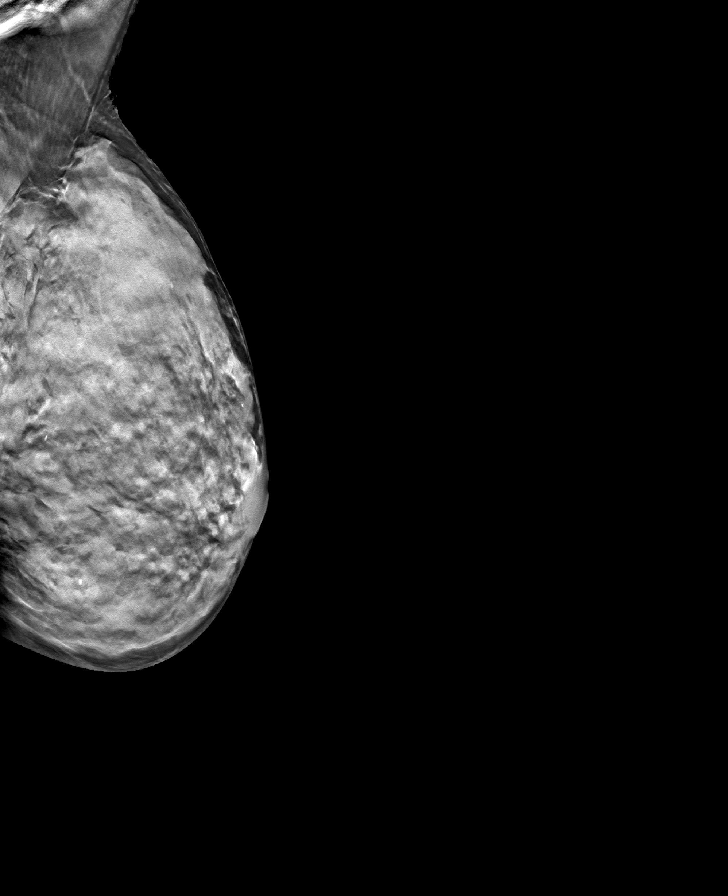

[8 of 24 positions shown; findings below may reference images not displayed]

ACR Breast Density Category d: The breast tissue is extremely dense,
which lowers the sensitivity of mammography.
FINDINGS: No suspicious masses, calcifications, or changes in either breast.

Mammographic images were processed with CAD.

On physical exam, no suspicious lumps are identified.

Targeted ultrasound is performed, showing no sonographic
abnormalities in the region of the patient's left breast tenderness.
IMPRESSION: No mammographic or sonographic evidence of malignancy.

RECOMMENDATION:
Treatment of the patient's symptoms should be based on clinical and
physical exam given lack of imaging findings. Annual screening
mammography.

I have discussed the findings and recommendations with the patient.
Results were also provided in writing at the conclusion of the
visit. If applicable, a reminder letter will be sent to the patient
regarding the next appointment.

BI-RADS CATEGORY  2: Benign.

## 2021-03-09 NOTE — Telephone Encounter (Signed)
Patient is wanting to know if she can use Eucerin on burning ,stinging area(left bottom of foot)The Voltaren caused the stinging and  burning. Please advise.

## 2021-03-11 NOTE — Telephone Encounter (Signed)
Returned call to patient,no answer, left vmessage per his recommendations.

## 2021-03-22 ENCOUNTER — Other Ambulatory Visit (HOSPITAL_BASED_OUTPATIENT_CLINIC_OR_DEPARTMENT_OTHER): Payer: Self-pay

## 2021-03-22 DIAGNOSIS — E039 Hypothyroidism, unspecified: Secondary | ICD-10-CM | POA: Diagnosis not present

## 2021-03-22 DIAGNOSIS — M7742 Metatarsalgia, left foot: Secondary | ICD-10-CM | POA: Diagnosis not present

## 2021-03-22 DIAGNOSIS — M216X2 Other acquired deformities of left foot: Secondary | ICD-10-CM | POA: Diagnosis not present

## 2021-03-22 DIAGNOSIS — Z79899 Other long term (current) drug therapy: Secondary | ICD-10-CM | POA: Diagnosis not present

## 2021-03-22 DIAGNOSIS — Z23 Encounter for immunization: Secondary | ICD-10-CM | POA: Diagnosis not present

## 2021-03-22 DIAGNOSIS — E559 Vitamin D deficiency, unspecified: Secondary | ICD-10-CM | POA: Diagnosis not present

## 2021-03-22 DIAGNOSIS — M79673 Pain in unspecified foot: Secondary | ICD-10-CM | POA: Diagnosis not present

## 2021-03-22 DIAGNOSIS — Z5181 Encounter for therapeutic drug level monitoring: Secondary | ICD-10-CM | POA: Diagnosis not present

## 2021-03-22 DIAGNOSIS — E785 Hyperlipidemia, unspecified: Secondary | ICD-10-CM | POA: Diagnosis not present

## 2021-03-22 MED ORDER — SHINGRIX 50 MCG/0.5ML IM SUSR
INTRAMUSCULAR | 0 refills | Status: DC
  Filled 2021-03-22: qty 1, 1d supply, fill #0

## 2021-04-19 ENCOUNTER — Ambulatory Visit: Payer: Medicare HMO | Attending: Internal Medicine

## 2021-04-19 DIAGNOSIS — Z23 Encounter for immunization: Secondary | ICD-10-CM

## 2021-04-19 NOTE — Progress Notes (Signed)
   Covid-19 Vaccination Clinic  Name:  RHYTHM GUBBELS    MRN: [id number] DOB: 11/14/1945  04/19/2021  Ms. Bowditch was observed post Covid-19 immunization for 15 minutes without incident. She was provided with Vaccine Information Sheet and instruction to access the V-Safe system.   Ms. Lefevre was instructed to call 911 with any severe reactions post vaccine: Difficulty breathing  Swelling of face and throat  A fast heartbeat  A bad rash all over body  Dizziness and weakness

## 2021-04-27 ENCOUNTER — Other Ambulatory Visit (HOSPITAL_BASED_OUTPATIENT_CLINIC_OR_DEPARTMENT_OTHER): Payer: Self-pay

## 2021-04-27 MED ORDER — COVID-19MRNA BIVAL VACC PFIZER 30 MCG/0.3ML IM SUSP
INTRAMUSCULAR | 0 refills | Status: DC
  Filled 2021-04-27: qty 0.3, 1d supply, fill #0

## 2021-04-28 DIAGNOSIS — M25572 Pain in left ankle and joints of left foot: Secondary | ICD-10-CM | POA: Diagnosis not present

## 2021-05-04 DIAGNOSIS — M79672 Pain in left foot: Secondary | ICD-10-CM | POA: Diagnosis not present

## 2021-05-05 ENCOUNTER — Other Ambulatory Visit: Payer: Self-pay | Admitting: Sports Medicine

## 2021-05-05 DIAGNOSIS — M79672 Pain in left foot: Secondary | ICD-10-CM

## 2021-05-10 ENCOUNTER — Other Ambulatory Visit (HOSPITAL_BASED_OUTPATIENT_CLINIC_OR_DEPARTMENT_OTHER): Payer: Self-pay

## 2021-05-10 DIAGNOSIS — L851 Acquired keratosis [keratoderma] palmaris et plantaris: Secondary | ICD-10-CM | POA: Diagnosis not present

## 2021-05-10 DIAGNOSIS — M216X2 Other acquired deformities of left foot: Secondary | ICD-10-CM | POA: Diagnosis not present

## 2021-05-10 MED ORDER — INFLUENZA VAC A&B SA ADJ QUAD 0.5 ML IM PRSY
PREFILLED_SYRINGE | INTRAMUSCULAR | 0 refills | Status: DC
  Filled 2021-05-10: qty 0.5, 1d supply, fill #0

## 2021-05-20 ENCOUNTER — Ambulatory Visit: Payer: Medicare HMO | Admitting: Sports Medicine

## 2021-05-20 DIAGNOSIS — M25572 Pain in left ankle and joints of left foot: Secondary | ICD-10-CM | POA: Diagnosis not present

## 2021-05-26 ENCOUNTER — Other Ambulatory Visit: Payer: Medicare HMO

## 2021-05-27 ENCOUNTER — Other Ambulatory Visit (HOSPITAL_BASED_OUTPATIENT_CLINIC_OR_DEPARTMENT_OTHER): Payer: Self-pay

## 2021-05-27 MED ORDER — PREGABALIN 25 MG PO CAPS
25.0000 mg | ORAL_CAPSULE | Freq: Two times a day (BID) | ORAL | 2 refills | Status: DC
  Filled 2021-05-27: qty 60, 30d supply, fill #0

## 2021-05-28 ENCOUNTER — Other Ambulatory Visit (HOSPITAL_BASED_OUTPATIENT_CLINIC_OR_DEPARTMENT_OTHER): Payer: Self-pay

## 2021-06-02 ENCOUNTER — Other Ambulatory Visit (HOSPITAL_BASED_OUTPATIENT_CLINIC_OR_DEPARTMENT_OTHER): Payer: Self-pay

## 2021-06-03 ENCOUNTER — Other Ambulatory Visit (HOSPITAL_BASED_OUTPATIENT_CLINIC_OR_DEPARTMENT_OTHER): Payer: Self-pay

## 2021-06-22 ENCOUNTER — Ambulatory Visit: Payer: Medicare HMO | Admitting: Internal Medicine

## 2021-06-29 ENCOUNTER — Ambulatory Visit (INDEPENDENT_AMBULATORY_CARE_PROVIDER_SITE_OTHER): Payer: Medicare HMO | Admitting: Internal Medicine

## 2021-06-29 ENCOUNTER — Encounter: Payer: Self-pay | Admitting: Internal Medicine

## 2021-06-29 VITALS — BP 120/80 | HR 76 | Ht 61.0 in | Wt 91.0 lb

## 2021-06-29 DIAGNOSIS — G5792 Unspecified mononeuropathy of left lower limb: Secondary | ICD-10-CM | POA: Diagnosis not present

## 2021-06-29 DIAGNOSIS — E039 Hypothyroidism, unspecified: Secondary | ICD-10-CM

## 2021-06-29 NOTE — Progress Notes (Signed)
Name: Katelyn Anthony  MRN/ DOB: [id number], April 28, 1946    Age/ Sex: 75 y.o., female     PCP: Maurice Small, MD (Inactive)   Reason for Endocrinology Evaluation: Hypothyroidism     Initial Endocrinology Clinic Visit: 11/26/2019    PATIENT IDENTIFIER: Katelyn Anthony is a 75 y.o., female with a past medical history of  hypothyroidism, dyslipidemia and MVP. She has followed with [facility] since 11/26/2019  for consultative assistance with management of her Hypothyroidism.   HISTORICAL SUMMARY:  She has been diagnosed with hypothyroidism since 1990'. She uses brand only .   SUBJECTIVE:     Today (06/30/2021):  Katelyn Anthony is here for hypothyroidism. She has not been here in almost a year.   Weight fluctuates  Denies loose stools or diarrhea , she is constipated Denies palpitation Denies local neck symptoms   Hair is falling out and skin is dry   She is requesting B12 and D check as she had them high in the past and was told to stop supplements, does not recall when that as     Levothyroxine 75 mcg , HALF a tablet on Sundays and 1 tablet the rest of the week   Developed neuropathy of the left foot in 09/2020  for following a callous removal that is attributed to the acid  removed.  She tried Lyrica for foot pain, but caused abdominal pain through Podiatry   Gabapentin caused fatigue and constipation      HISTORY:  Past Medical History:  Past Medical History:  Diagnosis Date   Allergy    feathers   Cataract    Cholelithiasis with cholecystitis    Chronic   Hypothyroidism    MVP (mitral valve prolapse)    Osteopenia    Pneumonia    Thyroid disease    Past Surgical History:  Past Surgical History:  Procedure Laterality Date   ABDOMINAL HYSTERECTOMY     APPENDECTOMY     CHOLECYSTECTOMY N/A 01/10/2018   Procedure: LAPAROSCOPIC CHOLECYSTECTOMY WITH INTRAOPERATIVE CHOLANGIOGRAM;  Surgeon: Excell Seltzer, MD;  Location: [place];  Service: General;   Laterality: N/A;   COLON SURGERY     diverticulitis   COLONOSCOPY WITH PROPOFOL N/A 03/09/2016   Procedure: COLONOSCOPY WITH PROPOFOL;  Surgeon: Arta Silence, MD;  [facility] END[facility];  Service: Endoscopy;  Laterality: N/A;   TONSILLECTOMY AND ADENOIDECTOMY     Social History:  reports that she has quit smoking. Her smoking use included cigarettes. She has never used smokeless tobacco. She reports current alcohol use of about 2.0 standard drinks per week. She reports that she does not use drugs. Family History:  Family History  Problem Relation Age of Onset   Colon cancer Mother      HOME MEDICATIONS: Allergies as of 06/29/2021       Reactions   Other    Other reaction(s): unknown   Amoxicillin-pot Clavulanate Diarrhea    muscle pain, can tolerate short durations of amoxicillin  Has patient had a PCN reaction causing immediate rash, facial/tongue/throat swelling, SOB or lightheadedness with hypotension: No Has patient had a PCN reaction causing severe rash involving mucus membranes or skin necrosis: No Has patient had a PCN reaction that required hospitalization: Unknown Has patient had a PCN reaction occurring within the last 10 years: Unknown   Azithromycin Hives   Levofloxacin Other (See Comments)   REACTION: itchy skin and bloody stools   Naproxen Sodium Rash   Sulfonamide Derivatives Other (See Comments)  combative        Medication List        Accurate as of June 29, 2021 11:59 PM. If you have any questions, ask your nurse or doctor.          amoxicillin 500 MG tablet Commonly known as: AMOXIL Take 500 mg by mouth as needed. Prior to dental appointments   estradiol 0.1 MG/24HR patch Commonly known as: VIVELLE-DOT Place 1 patch onto the skin once a week.   Fluad Quadrivalent 0.5 ML injection Generic drug: influenza vaccine adjuvanted Inject into the muscle.   levothyroxine 75 MCG tablet Commonly known as: Synthroid Take 1 tablet (75 mcg total)  by mouth as directed.   meloxicam 7.5 MG tablet Commonly known as: Mobic Take 1 tablet (7.5 mg total) by mouth daily.   Pfizer COVID-19 Vac Bivalent injection Generic drug: COVID-19 mRNA bivalent vaccine Therapist, music) Inject into the muscle.   Pfizer-BioNT COVID-19 Vac-TriS Susp injection Generic drug: COVID-19 mRNA Vac-TriS (Pfizer) Inject into the muscle.   pregabalin 25 MG capsule Commonly known as: Lyrica Take 1 capsule by mouth twice a day   Shingrix injection Generic drug: Zoster Vaccine Adjuvanted Inject into the muscle.          OBJECTIVE:   PHYSICAL EXAM: VS: BP 120/80 (BP Location: Left Arm, Patient Position: Sitting, Cuff Size: Small)   Pulse 76   Ht 5\' 1"  (1.549 m)   Wt 91 lb (41.3 kg)   SpO2 97%   BMI 17.19 kg/m    EXAM: General: Pt appears well and is in NAD  Neck: General: Supple without adenopathy. Thyroid: Thyroid size normal.  No goiter or nodules appreciated. No thyroid bruit.  Lungs: Clear with good BS bilat with no rales, rhonchi, or wheezes  Heart: Auscultation: RRR.  Abdomen: Normoactive bowel sounds, soft, nontender, without masses or organomegaly palpable  Extremities:  BL LE: No pretibial edema normal ROM and strength.  Mental Status: Judgment, insight: Intact Orientation: Oriented to time, place, and person Mood and affect: No depression, anxiety, or agitation     DATA REVIEWED:  Latest Reference Range & Units 06/29/21 16:17  VITD 30.00 - 100.00 ng/mL 33.94  Vitamin B12 211 - 911 pg/mL 391  TSH 0.35 - 5.50 uIU/mL 0.82     ASSESSMENT / PLAN / RECOMMENDATIONS:   Hypothyroidism :   - She is clinically euthyroid  - TSh normal  - No changes  - Vitamin B and D are normal   Medications   Continue Levothyroxine 75 mcg , HALF a tablet on Sundays and 1 tablet the rest of the week   2. Left Foot Neuropathy:  - This started in 09/2020 following a callous formation. Gabapentin and Lyrica caused side effects, she would like to see a  neurologist for this as she is now being followed by sports medicine   - A referral has been placed   F/U in 1 yr   Signed electronically by: Mack Guise, MD  [facility]  [facility] [address] [place], [place] [address] Phone: [phone] FAX: [phone]      CC: Maurice Small, MD (Inactive) [address] [place] [address] Phone: [phone]  Fax: [phone]   Return to Endocrinology clinic as below: No future appointments.

## 2021-06-30 ENCOUNTER — Other Ambulatory Visit (HOSPITAL_BASED_OUTPATIENT_CLINIC_OR_DEPARTMENT_OTHER): Payer: Self-pay

## 2021-06-30 LAB — VITAMIN D 25 HYDROXY (VIT D DEFICIENCY, FRACTURES): VITD: 33.94 ng/mL (ref 30.00–100.00)

## 2021-06-30 LAB — VITAMIN B12: Vitamin B-12: 391 pg/mL (ref 211–911)

## 2021-06-30 LAB — TSH: TSH: 0.82 u[IU]/mL (ref 0.35–5.50)

## 2021-06-30 MED ORDER — SYNTHROID 75 MCG PO TABS
75.0000 ug | ORAL_TABLET | ORAL | 3 refills | Status: DC
  Filled 2021-06-30: qty 85, 90d supply, fill #0
  Filled 2021-09-15: qty 78, 90d supply, fill #0
  Filled 2021-12-15: qty 30, 30d supply, fill #0

## 2021-07-01 ENCOUNTER — Encounter: Payer: Self-pay | Admitting: Neurology

## 2021-07-07 ENCOUNTER — Other Ambulatory Visit (HOSPITAL_BASED_OUTPATIENT_CLINIC_OR_DEPARTMENT_OTHER): Payer: Self-pay

## 2021-07-14 DIAGNOSIS — M25572 Pain in left ankle and joints of left foot: Secondary | ICD-10-CM | POA: Diagnosis not present

## 2021-07-21 DIAGNOSIS — M216X1 Other acquired deformities of right foot: Secondary | ICD-10-CM | POA: Diagnosis not present

## 2021-07-21 DIAGNOSIS — M7742 Metatarsalgia, left foot: Secondary | ICD-10-CM | POA: Diagnosis not present

## 2021-09-01 ENCOUNTER — Other Ambulatory Visit (HOSPITAL_BASED_OUTPATIENT_CLINIC_OR_DEPARTMENT_OTHER): Payer: Self-pay

## 2021-09-01 DIAGNOSIS — L851 Acquired keratosis [keratoderma] palmaris et plantaris: Secondary | ICD-10-CM | POA: Diagnosis not present

## 2021-09-01 DIAGNOSIS — M216X1 Other acquired deformities of right foot: Secondary | ICD-10-CM | POA: Diagnosis not present

## 2021-09-08 ENCOUNTER — Other Ambulatory Visit: Payer: Self-pay | Admitting: Obstetrics & Gynecology

## 2021-09-08 DIAGNOSIS — Z1231 Encounter for screening mammogram for malignant neoplasm of breast: Secondary | ICD-10-CM

## 2021-09-13 ENCOUNTER — Ambulatory Visit: Payer: Medicare HMO | Admitting: Neurology

## 2021-09-15 ENCOUNTER — Other Ambulatory Visit (HOSPITAL_BASED_OUTPATIENT_CLINIC_OR_DEPARTMENT_OTHER): Payer: Self-pay

## 2021-09-15 MED ORDER — SYNTHROID 75 MCG PO TABS
ORAL_TABLET | ORAL | 0 refills | Status: DC
  Filled 2021-09-15: qty 85, 85d supply, fill #0

## 2021-09-29 ENCOUNTER — Other Ambulatory Visit: Payer: Self-pay

## 2021-09-29 ENCOUNTER — Ambulatory Visit
Admission: RE | Admit: 2021-09-29 | Discharge: 2021-09-29 | Disposition: A | Discharge status: Home / Self Care | Payer: Medicare HMO | Source: Ambulatory Visit | Attending: Obstetrics & Gynecology | Admitting: Obstetrics & Gynecology

## 2021-09-29 DIAGNOSIS — Z1231 Encounter for screening mammogram for malignant neoplasm of breast: Secondary | ICD-10-CM | POA: Diagnosis not present

## 2021-09-30 ENCOUNTER — Other Ambulatory Visit: Payer: Self-pay | Admitting: Obstetrics & Gynecology

## 2021-09-30 DIAGNOSIS — R928 Other abnormal and inconclusive findings on diagnostic imaging of breast: Secondary | ICD-10-CM

## 2021-10-13 ENCOUNTER — Other Ambulatory Visit: Payer: Self-pay

## 2021-10-13 ENCOUNTER — Ambulatory Visit
Admission: RE | Admit: 2021-10-13 | Discharge: 2021-10-13 | Disposition: A | Discharge status: Home / Self Care | Payer: Medicare HMO | Source: Ambulatory Visit | Attending: Obstetrics & Gynecology | Admitting: Obstetrics & Gynecology

## 2021-10-13 ENCOUNTER — Other Ambulatory Visit: Payer: Self-pay | Admitting: Obstetrics & Gynecology

## 2021-10-13 DIAGNOSIS — R928 Other abnormal and inconclusive findings on diagnostic imaging of breast: Secondary | ICD-10-CM

## 2021-10-13 DIAGNOSIS — R921 Mammographic calcification found on diagnostic imaging of breast: Secondary | ICD-10-CM

## 2021-10-13 DIAGNOSIS — R922 Inconclusive mammogram: Secondary | ICD-10-CM | POA: Diagnosis not present

## 2021-11-02 ENCOUNTER — Telehealth: Payer: Self-pay

## 2021-11-02 ENCOUNTER — Encounter: Payer: Self-pay | Admitting: Internal Medicine

## 2021-11-02 NOTE — Telephone Encounter (Addendum)
Patient states that she put celiac acid on her foot and wants to know if this can dehydrated her and cause her BUN level to be abnormal. ?

## 2021-11-05 ENCOUNTER — Other Ambulatory Visit (HOSPITAL_BASED_OUTPATIENT_CLINIC_OR_DEPARTMENT_OTHER): Payer: Self-pay

## 2021-11-05 DIAGNOSIS — L851 Acquired keratosis [keratoderma] palmaris et plantaris: Secondary | ICD-10-CM | POA: Diagnosis not present

## 2021-11-05 DIAGNOSIS — M216X2 Other acquired deformities of left foot: Secondary | ICD-10-CM | POA: Diagnosis not present

## 2021-11-05 MED ORDER — ZOSTER VAC RECOMB ADJUVANTED 50 MCG/0.5ML IM SUSR
INTRAMUSCULAR | 0 refills | Status: DC
  Filled 2021-11-05: qty 1, 1d supply, fill #0

## 2021-11-17 ENCOUNTER — Telehealth: Payer: Self-pay

## 2021-11-17 NOTE — Telephone Encounter (Signed)
Detailed vm left for patient.  ?

## 2021-11-17 NOTE — Telephone Encounter (Signed)
Patient would like to know if you have any recommendations for a pcp.  ?

## 2021-12-15 ENCOUNTER — Other Ambulatory Visit: Payer: Self-pay

## 2021-12-15 ENCOUNTER — Other Ambulatory Visit (HOSPITAL_BASED_OUTPATIENT_CLINIC_OR_DEPARTMENT_OTHER): Payer: Self-pay

## 2021-12-15 MED ORDER — LEVOTHYROXINE SODIUM 75 MCG PO TABS
75.0000 ug | ORAL_TABLET | ORAL | 0 refills | Status: DC
  Filled 2021-12-15: qty 10, 10d supply, fill #0

## 2021-12-17 ENCOUNTER — Other Ambulatory Visit: Payer: Self-pay | Admitting: Internal Medicine

## 2021-12-17 ENCOUNTER — Other Ambulatory Visit (HOSPITAL_BASED_OUTPATIENT_CLINIC_OR_DEPARTMENT_OTHER): Payer: Self-pay

## 2021-12-17 DIAGNOSIS — E039 Hypothyroidism, unspecified: Secondary | ICD-10-CM

## 2021-12-17 MED FILL — Levothyroxine Sodium Tab 75 MCG: ORAL | 90 days supply | Qty: 90 | Fill #0 | Status: AC

## 2021-12-24 ENCOUNTER — Other Ambulatory Visit (HOSPITAL_BASED_OUTPATIENT_CLINIC_OR_DEPARTMENT_OTHER): Payer: Self-pay

## 2022-01-05 DIAGNOSIS — M216X1 Other acquired deformities of right foot: Secondary | ICD-10-CM | POA: Diagnosis not present

## 2022-01-05 DIAGNOSIS — L851 Acquired keratosis [keratoderma] palmaris et plantaris: Secondary | ICD-10-CM | POA: Diagnosis not present

## 2022-01-18 ENCOUNTER — Other Ambulatory Visit (HOSPITAL_BASED_OUTPATIENT_CLINIC_OR_DEPARTMENT_OTHER): Payer: Self-pay

## 2022-02-14 ENCOUNTER — Other Ambulatory Visit (HOSPITAL_BASED_OUTPATIENT_CLINIC_OR_DEPARTMENT_OTHER): Payer: Self-pay

## 2022-02-18 ENCOUNTER — Other Ambulatory Visit (HOSPITAL_BASED_OUTPATIENT_CLINIC_OR_DEPARTMENT_OTHER): Payer: Self-pay

## 2022-02-21 ENCOUNTER — Other Ambulatory Visit (HOSPITAL_BASED_OUTPATIENT_CLINIC_OR_DEPARTMENT_OTHER): Payer: Self-pay

## 2022-02-21 MED ORDER — AMOXICILLIN 500 MG PO CAPS
2000.0000 mg | ORAL_CAPSULE | Freq: Every day | ORAL | 2 refills | Status: DC
  Filled 2022-02-21 (×3): qty 16, 4d supply, fill #0
  Filled 2022-10-12 – 2022-10-31 (×2): qty 16, 4d supply, fill #1

## 2022-03-14 ENCOUNTER — Other Ambulatory Visit (HOSPITAL_BASED_OUTPATIENT_CLINIC_OR_DEPARTMENT_OTHER): Payer: Self-pay

## 2022-03-14 DIAGNOSIS — L84 Corns and callosities: Secondary | ICD-10-CM | POA: Diagnosis not present

## 2022-03-14 DIAGNOSIS — M216X2 Other acquired deformities of left foot: Secondary | ICD-10-CM | POA: Diagnosis not present

## 2022-03-14 MED FILL — Levothyroxine Sodium Tab 75 MCG: ORAL | 90 days supply | Qty: 90 | Fill #1 | Status: AC

## 2022-03-15 ENCOUNTER — Other Ambulatory Visit (HOSPITAL_BASED_OUTPATIENT_CLINIC_OR_DEPARTMENT_OTHER): Payer: Self-pay

## 2022-03-15 DIAGNOSIS — L84 Corns and callosities: Secondary | ICD-10-CM | POA: Insufficient documentation

## 2022-03-24 ENCOUNTER — Other Ambulatory Visit (HOSPITAL_BASED_OUTPATIENT_CLINIC_OR_DEPARTMENT_OTHER): Payer: Self-pay

## 2022-03-25 ENCOUNTER — Other Ambulatory Visit (HOSPITAL_BASED_OUTPATIENT_CLINIC_OR_DEPARTMENT_OTHER): Payer: Self-pay

## 2022-04-05 DIAGNOSIS — H6122 Impacted cerumen, left ear: Secondary | ICD-10-CM | POA: Diagnosis not present

## 2022-04-05 DIAGNOSIS — J3489 Other specified disorders of nose and nasal sinuses: Secondary | ICD-10-CM | POA: Diagnosis not present

## 2022-04-07 ENCOUNTER — Other Ambulatory Visit: Payer: Self-pay | Admitting: Internal Medicine

## 2022-04-07 ENCOUNTER — Other Ambulatory Visit (HOSPITAL_BASED_OUTPATIENT_CLINIC_OR_DEPARTMENT_OTHER): Payer: Self-pay

## 2022-04-07 DIAGNOSIS — E039 Hypothyroidism, unspecified: Secondary | ICD-10-CM

## 2022-04-07 MED ORDER — SYNTHROID 75 MCG PO TABS
75.0000 ug | ORAL_TABLET | Freq: Every day | ORAL | 1 refills | Status: DC
  Filled 2022-04-07: qty 90, 90d supply, fill #0

## 2022-04-15 DIAGNOSIS — M216X2 Other acquired deformities of left foot: Secondary | ICD-10-CM | POA: Diagnosis not present

## 2022-04-15 DIAGNOSIS — L84 Corns and callosities: Secondary | ICD-10-CM | POA: Diagnosis not present

## 2022-05-26 ENCOUNTER — Other Ambulatory Visit (HOSPITAL_BASED_OUTPATIENT_CLINIC_OR_DEPARTMENT_OTHER): Payer: Self-pay

## 2022-05-26 MED ORDER — COMIRNATY 30 MCG/0.3ML IM SUSY
PREFILLED_SYRINGE | INTRAMUSCULAR | 0 refills | Status: DC
  Filled 2022-05-26: qty 0.3, 1d supply, fill #0

## 2022-06-24 ENCOUNTER — Encounter: Payer: Self-pay | Admitting: Internal Medicine

## 2022-06-24 ENCOUNTER — Ambulatory Visit (INDEPENDENT_AMBULATORY_CARE_PROVIDER_SITE_OTHER): Payer: Medicare HMO | Admitting: Internal Medicine

## 2022-06-24 ENCOUNTER — Other Ambulatory Visit (HOSPITAL_BASED_OUTPATIENT_CLINIC_OR_DEPARTMENT_OTHER): Payer: Self-pay

## 2022-06-24 VITALS — BP 132/90 | HR 66 | Ht 61.0 in | Wt 90.0 lb

## 2022-06-24 DIAGNOSIS — E559 Vitamin D deficiency, unspecified: Secondary | ICD-10-CM

## 2022-06-24 DIAGNOSIS — E039 Hypothyroidism, unspecified: Secondary | ICD-10-CM

## 2022-06-24 LAB — MAGNESIUM: Magnesium: 2 mg/dL (ref 1.5–2.5)

## 2022-06-24 LAB — VITAMIN D 25 HYDROXY (VIT D DEFICIENCY, FRACTURES): VITD: 26.55 ng/mL — ABNORMAL LOW (ref 30.00–100.00)

## 2022-06-24 LAB — TSH: TSH: 0.87 u[IU]/mL (ref 0.35–5.50)

## 2022-06-24 MED ORDER — SYNTHROID 75 MCG PO TABS
75.0000 ug | ORAL_TABLET | ORAL | 3 refills | Status: DC
  Filled 2022-06-24 – 2022-08-01 (×2): qty 85, 85d supply, fill #0
  Filled 2022-10-31: qty 85, 85d supply, fill #1
  Filled 2023-01-30: qty 85, 85d supply, fill #2
  Filled 2023-04-19: qty 85, 85d supply, fill #3
  Filled ????-??-??: fill #3

## 2022-06-24 NOTE — Progress Notes (Signed)
Name: Katelyn Anthony  MRN/ DOB: [id number], 05-Oct-1945    Age/ Sex: 76 y.o., female     PCP: Pcp, No   Reason for Endocrinology Evaluation: Hypothyroidism     Initial Endocrinology Clinic Visit: 11/26/2019    PATIENT IDENTIFIER: Katelyn Anthony is a 76 y.o., female with a past medical history of  hypothyroidism, dyslipidemia and MVP. She has followed with [facility] since 11/26/2019  for consultative assistance with management of her Hypothyroidism.   HISTORICAL SUMMARY:  She has been diagnosed with hypothyroidism since 1990'. She uses brand only .   SUBJECTIVE:    Today (06/24/2022):  Katelyn Anthony is here for hypothyroidism. She has not been here in almost a year.   Weight stable  Denies loose stools or diarrhea , she is constipated Denies palpitation Denies local neck symptoms     Levothyroxine 75 mcg , HALF a tablet on Sundays and 1 tablet the rest of the week   Developed neuropathy of the left foot in 09/2020  for following a callus removal that is attributed to the acid application.  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 Has a pending appointment with [facility] neurosurgeon     HISTORY:  Past Medical History:  Past Medical History:  Diagnosis Date   Allergy    feathers   Cataract    Cholelithiasis with cholecystitis    Chronic   Hypothyroidism    MVP (mitral valve prolapse)    Osteopenia    Pneumonia    Thyroid disease    Past Surgical History:  Past Surgical History:  Procedure Laterality Date   ABDOMINAL HYSTERECTOMY     APPENDECTOMY     BREAST BIOPSY Left 05/17/2017   CHOLECYSTECTOMY N/A 01/10/2018   Procedure: LAPAROSCOPIC CHOLECYSTECTOMY WITH INTRAOPERATIVE CHOLANGIOGRAM;  Surgeon: Excell Seltzer, MD;  Location: [facility];  Service: General;  Laterality: N/A;   COLON  SURGERY     diverticulitis   COLONOSCOPY WITH PROPOFOL N/A 03/09/2016   Procedure: COLONOSCOPY WITH PROPOFOL;  Surgeon: Arta Silence, MD;  Location: [facility] ENDOSCOPY;  Service: Endoscopy;  Laterality: N/A;   TONSILLECTOMY AND ADENOIDECTOMY     Social History:  reports that she has quit smoking. Her smoking use included cigarettes. She has never used smokeless tobacco. She reports current alcohol use of about 2.0 standard drinks of alcohol per week. She reports that she does not use drugs. Family History:  Family History  Problem Relation Age of Onset   Colon cancer Mother      HOME MEDICATIONS: Allergies as of 06/24/2022       Reactions   Other    Other reaction(s): unknown   Amoxicillin-pot Clavulanate Diarrhea    muscle pain, can tolerate short durations of amoxicillin  Has patient had a PCN reaction causing immediate rash, facial/tongue/throat swelling, SOB or lightheadedness with hypotension: No Has patient had a PCN reaction causing severe rash involving mucus membranes or skin necrosis: No Has patient had a PCN reaction that required hospitalization: Unknown Has patient had a PCN reaction occurring within the last 10 years: Unknown   Azithromycin Hives   Levofloxacin Other (See Comments)   REACTION: itchy skin and bloody stools   Naproxen Sodium Rash   Sulfonamide Derivatives Other (See Comments)   combative        Medication List  Accurate as of June 24, 2022 10:04 AM. If you have any questions, ask your nurse or doctor.          amoxicillin 500 MG tablet Commonly known as: AMOXIL Take 500 mg by mouth as needed. Prior to dental appointments   amoxicillin 500 MG capsule Commonly known as: AMOXIL Take 4 capsules (2,000 mg total) by mouth 1 hour prior to dental appointment.   estradiol 0.1 MG/24HR patch Commonly known as: VIVELLE-DOT Place 1 patch onto the skin once a week.   Fluad Quadrivalent 0.5 ML injection Generic drug: influenza vaccine  adjuvanted Inject into the muscle.   levothyroxine 75 MCG tablet Commonly known as: Synthroid Take 1/2 tablet by mouth on Sunday and take 1 tablet by mouth the rest of the week   Synthroid 75 MCG tablet Generic drug: levothyroxine Take 1 tablet (75 mcg total) by mouth daily. *Schedule follow up appt*   meloxicam 7.5 MG tablet Commonly known as: Mobic Take 1 tablet (7.5 mg total) by mouth daily.   Pfizer COVID-19 Vac Bivalent injection Generic drug: COVID-19 mRNA bivalent vaccine Therapist, music) Inject into the muscle.   Pfizer-BioNT COVID-19 Vac-TriS Susp injection Generic drug: COVID-19 mRNA Vac-TriS (Pfizer) Inject into the muscle.   Comirnaty syringe Generic drug: COVID-19 mRNA vaccine 2023-2024 Inject into the muscle.   pregabalin 25 MG capsule Commonly known as: Lyrica Take 1 capsule by mouth twice a day   Shingrix injection Generic drug: Zoster Vaccine Adjuvanted Inject into the muscle.   Shingrix injection Generic drug: Zoster Vaccine Adjuvanted Inject into the muscle.          OBJECTIVE:   PHYSICAL EXAM: VS: BP (!) 132/90 (BP Location: Left Arm, Patient Position: Sitting, Cuff Size: Normal)   Pulse 66   Ht '5\' 1"'$  (1.549 m)   Wt 90 lb (40.8 kg)   SpO2 99%   BMI 17.01 kg/m    EXAM: General: Pt appears well and is in NAD  Neck: General: Supple without adenopathy. Thyroid: Thyroid size normal.  No goiter or nodules appreciated. No thyroid bruit.  Lungs: Clear with good BS bilat with no rales, rhonchi, or wheezes  Heart: Auscultation: RRR.  Abdomen: Normoactive bowel sounds, soft, nontender, without masses or organomegaly palpable  Extremities:  BL LE: No pretibial edema normal ROM and strength.  Mental Status: Judgment, insight: Intact Orientation: Oriented to time, place, and person Mood and affect: No depression, anxiety, or agitation     DATA REVIEWED:  Latest Reference Range & Units 06/24/22 10:07  TSH 0.35 - 5.50 uIU/mL 0.87    Latest  Reference Range & Units 06/24/22 10:07  Magnesium 1.5 - 2.5 mg/dL 2.0  VITD 30.00 - 100.00 ng/mL 26.55 (L)    ASSESSMENT / PLAN / RECOMMENDATIONS:   Hypothyroidism :   - She is clinically euthyroid  - TSh normal  - No changes   Medications   Continue Levothyroxine 75 mcg , HALF a tablet on Sundays and 1 tablet the rest of the week   2. Left Foot Neuropathy:  - This started in 09/2020 following a callous formation. Gabapentin and Lyrica caused side effects -This has been very distressing for the patient, she has an appointment with neurosurgery at [facility] in March  3.  Vitamin D insufficiency:  -Vitamin D low at 44 -I have advised the patient to start a multivitamin 50+4 woman daily from now 1 -She understands that she needs to separate this from Synthroid by 4 hours -Patient would like magnesium to be checked  as she read that it can help with inflammation.  Magnesium level normal F/U in 1 yr   Signed electronically by: Mack Guise, MD  [facility]  [facility] [address] [place], [place] [address] Phone: [phone] FAX: [phone]      CC: Pcp, No No address on file Phone: None  Fax: None   Return to Endocrinology clinic as below: No future appointments.

## 2022-06-27 DIAGNOSIS — M216X1 Other acquired deformities of right foot: Secondary | ICD-10-CM | POA: Diagnosis not present

## 2022-07-01 ENCOUNTER — Other Ambulatory Visit (HOSPITAL_BASED_OUTPATIENT_CLINIC_OR_DEPARTMENT_OTHER): Payer: Self-pay

## 2022-07-06 ENCOUNTER — Other Ambulatory Visit (HOSPITAL_BASED_OUTPATIENT_CLINIC_OR_DEPARTMENT_OTHER): Payer: Self-pay

## 2022-07-06 MED ORDER — FLUAD QUADRIVALENT 0.5 ML IM PRSY
PREFILLED_SYRINGE | INTRAMUSCULAR | 0 refills | Status: DC
  Filled 2022-07-06: qty 0.5, 1d supply, fill #0

## 2022-08-01 ENCOUNTER — Other Ambulatory Visit (HOSPITAL_BASED_OUTPATIENT_CLINIC_OR_DEPARTMENT_OTHER): Payer: Self-pay

## 2022-08-01 ENCOUNTER — Other Ambulatory Visit: Payer: Self-pay

## 2022-08-10 ENCOUNTER — Other Ambulatory Visit (HOSPITAL_BASED_OUTPATIENT_CLINIC_OR_DEPARTMENT_OTHER): Payer: Self-pay

## 2022-08-22 DIAGNOSIS — L84 Corns and callosities: Secondary | ICD-10-CM | POA: Diagnosis not present

## 2022-09-01 DIAGNOSIS — N959 Unspecified menopausal and perimenopausal disorder: Secondary | ICD-10-CM | POA: Diagnosis not present

## 2022-09-01 DIAGNOSIS — Z01419 Encounter for gynecological examination (general) (routine) without abnormal findings: Secondary | ICD-10-CM | POA: Diagnosis not present

## 2022-09-01 DIAGNOSIS — N958 Other specified menopausal and perimenopausal disorders: Secondary | ICD-10-CM | POA: Diagnosis not present

## 2022-09-02 ENCOUNTER — Other Ambulatory Visit (HOSPITAL_BASED_OUTPATIENT_CLINIC_OR_DEPARTMENT_OTHER): Payer: Self-pay

## 2022-09-02 DIAGNOSIS — T148XXA Other injury of unspecified body region, initial encounter: Secondary | ICD-10-CM | POA: Diagnosis not present

## 2022-09-02 MED ORDER — ESTRADIOL 0.1 MG/24HR TD PTTW
1.0000 | MEDICATED_PATCH | TRANSDERMAL | 0 refills | Status: DC
  Filled 2022-09-02: qty 8, 28d supply, fill #0

## 2022-10-11 ENCOUNTER — Ambulatory Visit: Payer: Medicare HMO | Admitting: Internal Medicine

## 2022-10-18 DIAGNOSIS — G5792 Unspecified mononeuropathy of left lower limb: Secondary | ICD-10-CM | POA: Diagnosis not present

## 2022-10-18 DIAGNOSIS — L84 Corns and callosities: Secondary | ICD-10-CM | POA: Diagnosis not present

## 2022-10-18 DIAGNOSIS — M7742 Metatarsalgia, left foot: Secondary | ICD-10-CM | POA: Diagnosis not present

## 2022-10-18 DIAGNOSIS — L909 Atrophic disorder of skin, unspecified: Secondary | ICD-10-CM | POA: Diagnosis not present

## 2022-10-18 DIAGNOSIS — M79672 Pain in left foot: Secondary | ICD-10-CM | POA: Diagnosis not present

## 2022-10-19 DIAGNOSIS — G5792 Unspecified mononeuropathy of left lower limb: Secondary | ICD-10-CM | POA: Insufficient documentation

## 2022-10-20 ENCOUNTER — Telehealth: Payer: Self-pay

## 2022-10-20 ENCOUNTER — Other Ambulatory Visit (HOSPITAL_BASED_OUTPATIENT_CLINIC_OR_DEPARTMENT_OTHER): Payer: Self-pay

## 2022-10-20 NOTE — Telephone Encounter (Signed)
Patient advised and verbalized understanding 

## 2022-10-20 NOTE — Telephone Encounter (Signed)
Patient states that she had appointment at [facility] and her blood pressure was 150/81. Patient wants to know if she should be concern if the medication is not controlling blood pressure.

## 2022-10-26 DIAGNOSIS — R269 Unspecified abnormalities of gait and mobility: Secondary | ICD-10-CM | POA: Diagnosis not present

## 2022-10-26 DIAGNOSIS — M79672 Pain in left foot: Secondary | ICD-10-CM | POA: Diagnosis not present

## 2022-10-31 ENCOUNTER — Other Ambulatory Visit (HOSPITAL_BASED_OUTPATIENT_CLINIC_OR_DEPARTMENT_OTHER): Payer: Self-pay

## 2022-10-31 DIAGNOSIS — M216X1 Other acquired deformities of right foot: Secondary | ICD-10-CM | POA: Diagnosis not present

## 2022-10-31 DIAGNOSIS — L84 Corns and callosities: Secondary | ICD-10-CM | POA: Diagnosis not present

## 2022-11-01 ENCOUNTER — Other Ambulatory Visit (HOSPITAL_BASED_OUTPATIENT_CLINIC_OR_DEPARTMENT_OTHER): Payer: Self-pay

## 2023-01-03 ENCOUNTER — Other Ambulatory Visit (HOSPITAL_BASED_OUTPATIENT_CLINIC_OR_DEPARTMENT_OTHER): Payer: Self-pay

## 2023-01-06 DIAGNOSIS — M7742 Metatarsalgia, left foot: Secondary | ICD-10-CM | POA: Diagnosis not present

## 2023-01-06 DIAGNOSIS — L84 Corns and callosities: Secondary | ICD-10-CM | POA: Diagnosis not present

## 2023-01-25 ENCOUNTER — Other Ambulatory Visit (HOSPITAL_BASED_OUTPATIENT_CLINIC_OR_DEPARTMENT_OTHER): Payer: Self-pay

## 2023-01-26 ENCOUNTER — Encounter (HOSPITAL_BASED_OUTPATIENT_CLINIC_OR_DEPARTMENT_OTHER): Payer: Self-pay

## 2023-01-26 ENCOUNTER — Other Ambulatory Visit: Payer: Self-pay

## 2023-01-26 ENCOUNTER — Emergency Department (HOSPITAL_BASED_OUTPATIENT_CLINIC_OR_DEPARTMENT_OTHER)
Admission: EM | Admit: 2023-01-26 | Discharge: 2023-01-26 | Disposition: A | Discharge status: Home / Self Care | Payer: Medicare HMO | Attending: Emergency Medicine | Admitting: Emergency Medicine

## 2023-01-26 DIAGNOSIS — W57XXXA Bitten or stung by nonvenomous insect and other nonvenomous arthropods, initial encounter: Secondary | ICD-10-CM | POA: Insufficient documentation

## 2023-01-26 DIAGNOSIS — S70361A Insect bite (nonvenomous), right thigh, initial encounter: Secondary | ICD-10-CM | POA: Insufficient documentation

## 2023-01-26 DIAGNOSIS — S80861A Insect bite (nonvenomous), right lower leg, initial encounter: Secondary | ICD-10-CM | POA: Diagnosis not present

## 2023-01-26 MED ORDER — KETOROLAC TROMETHAMINE 15 MG/ML IJ SOLN
15.0000 mg | Freq: Once | INTRAMUSCULAR | Status: AC
  Administered 2023-01-26: 15 mg via INTRAMUSCULAR
  Filled 2023-01-26: qty 1

## 2023-01-26 MED ORDER — DIPHENHYDRAMINE HCL 25 MG PO CAPS
50.0000 mg | ORAL_CAPSULE | Freq: Once | ORAL | Status: AC
  Administered 2023-01-26: 50 mg via ORAL
  Filled 2023-01-26: qty 2

## 2023-01-26 MED ORDER — ACETAMINOPHEN 500 MG PO TABS
1000.0000 mg | ORAL_TABLET | Freq: Once | ORAL | Status: AC
  Administered 2023-01-26: 1000 mg via ORAL
  Filled 2023-01-26: qty 2

## 2023-01-26 NOTE — ED Triage Notes (Signed)
Pt reports being stung by a bee or wasp in the back side of her left thigh. States the stinger is still in and needs it taken out. Pt denies any history of allergy to bees but that she hasn't been stung since she was a child. No signs of respiratory distress. Area is red with minimal swelling.

## 2023-01-26 NOTE — ED Provider Notes (Signed)
EMERGENCY DEPARTMENT AT [facility] Provider Note   CSN: [id number] Arrival date & time: 01/26/23  2105     History Chief Complaint  Patient presents with   Insect Bite    HPI Katelyn Anthony is a 77 y.o. female presenting for either a bug bite or other soft tissue injury.  States that she was near a bush loading a heavy object in her vehicle when she felt sudden stinging sensation to her left posterior thigh.  States it was severe in nature.  Denies fevers chills nausea vomiting shortness of breath.  Is otherwise dilatory tolerating p.o. intake.  States that she has a history of allergy to bee stings and is worried that it was a bee.  It happened 4 hours prior to arrival.  Has localized erythema but denies any other systemic symptoms.   Patient's recorded medical, surgical, social, medication list and allergies were reviewed in the Snapshot window as part of the initial history.   Review of Systems   Review of Systems  Constitutional:  Negative for chills and fever.  HENT:  Negative for ear pain and sore throat.   Eyes:  Negative for pain and visual disturbance.  Respiratory:  Negative for cough and shortness of breath.   Cardiovascular:  Negative for chest pain and palpitations.  Gastrointestinal:  Negative for abdominal pain and vomiting.  Genitourinary:  Negative for dysuria and hematuria.  Musculoskeletal:  Negative for arthralgias and back pain.  Skin:  Positive for rash. Negative for color change.  Neurological:  Negative for seizures and syncope.  All other systems reviewed and are negative.   Physical Exam Updated Vital Signs BP (!) 170/71 (BP Location: Left Arm)   Pulse 72   Temp 98 F (36.7 C) (Oral)   Resp 18   Ht 5' 1.5" (1.562 m)   Wt 40.8 kg   SpO2 98%   BMI 16.73 kg/m  Physical Exam Vitals and nursing note reviewed.  Constitutional:      General: She is not in acute distress.    Appearance: She is well-developed.  HENT:     Head:  Normocephalic and atraumatic.  Eyes:     Conjunctiva/sclera: Conjunctivae normal.  Cardiovascular:     Rate and Rhythm: Normal rate and regular rhythm.     Heart sounds: No murmur heard. Pulmonary:     Effort: Pulmonary effort is normal. No respiratory distress.     Breath sounds: Normal breath sounds.  Abdominal:     General: There is no distension.     Palpations: Abdomen is soft.     Tenderness: There is no abdominal tenderness. There is no right CVA tenderness or left CVA tenderness.  Musculoskeletal:        General: No swelling or tenderness. Normal range of motion.     Cervical back: Neck supple.  Skin:    General: Skin is warm and dry.     Findings: Erythema (3 cm circumscribed erythematous lesion to her right posterior thigh.) present.  Neurological:     General: No focal deficit present.     Mental Status: She is alert and oriented to person, place, and time. Mental status is at baseline.     Cranial Nerves: No cranial nerve deficit.      ED Course/ Medical Decision Making/ A&P    Procedures Ultrasound ED Soft Tissue  Date/Time: 01/26/2023 9:42 PM  Performed by: Glyn Ade, MD Authorized by: Glyn Ade, MD   Procedure details:  Indications: localization of abscess and evaluate for cellulitis     Transverse view:  Visualized   Longitudinal view:  Visualized   Images: archived     Limitations:  Body habitus Location:    Location: lower extremity     Side:  Right Findings:     no abscess present    no cellulitis present    no foreign body present    Medications Ordered in ED Medications  acetaminophen (TYLENOL) tablet 1,000 mg (has no administration in time range)  ketorolac (TORADOL) 15 MG/ML injection 15 mg (has no administration in time range)  diphenhydrAMINE (BENADRYL) capsule 50 mg (has no administration in time range)    Medical Decision Making:    JOIA TATIS is a 77 y.o. female who presented to the ED today with potential  bite or sting detailed above.   Ultrasound not demonstrate any retained foreign body.  Localized circumscribed erythema.  Considered snake bite or spider bite since there was no stinger identified, however patient thinks it was probably hornet. No evidence of cellulitis on ultrasound.  Will treat with diphenhydramine anti-inflammatories and plan for patient to follow-up with her primary care provider.  Current erythema was documented and labeled with the date per nursing and patient will follow-up with PCP if she has any interval worsening.   Clinical Impression:  1. Insect bite, unspecified site, initial encounter      Discharge   Final Clinical Impression(s) / ED Diagnoses Final diagnoses:  Insect bite, unspecified site, initial encounter    Rx / DC Orders ED Discharge Orders     None         Glyn Ade, MD 01/26/23 2144

## 2023-01-30 ENCOUNTER — Other Ambulatory Visit (HOSPITAL_BASED_OUTPATIENT_CLINIC_OR_DEPARTMENT_OTHER): Payer: Self-pay

## 2023-01-31 DIAGNOSIS — M898X7 Other specified disorders of bone, ankle and foot: Secondary | ICD-10-CM | POA: Diagnosis not present

## 2023-01-31 DIAGNOSIS — M79672 Pain in left foot: Secondary | ICD-10-CM | POA: Diagnosis not present

## 2023-02-07 ENCOUNTER — Other Ambulatory Visit (HOSPITAL_BASED_OUTPATIENT_CLINIC_OR_DEPARTMENT_OTHER): Payer: Self-pay

## 2023-02-08 ENCOUNTER — Other Ambulatory Visit (HOSPITAL_BASED_OUTPATIENT_CLINIC_OR_DEPARTMENT_OTHER): Payer: Self-pay

## 2023-02-10 ENCOUNTER — Other Ambulatory Visit (HOSPITAL_BASED_OUTPATIENT_CLINIC_OR_DEPARTMENT_OTHER): Payer: Self-pay

## 2023-02-10 DIAGNOSIS — L84 Corns and callosities: Secondary | ICD-10-CM | POA: Diagnosis not present

## 2023-02-10 DIAGNOSIS — M216X2 Other acquired deformities of left foot: Secondary | ICD-10-CM | POA: Diagnosis not present

## 2023-02-16 ENCOUNTER — Other Ambulatory Visit (HOSPITAL_BASED_OUTPATIENT_CLINIC_OR_DEPARTMENT_OTHER): Payer: Self-pay

## 2023-02-16 DIAGNOSIS — R0981 Nasal congestion: Secondary | ICD-10-CM | POA: Diagnosis not present

## 2023-02-16 DIAGNOSIS — B349 Viral infection, unspecified: Secondary | ICD-10-CM | POA: Diagnosis not present

## 2023-02-16 DIAGNOSIS — R5383 Other fatigue: Secondary | ICD-10-CM | POA: Diagnosis not present

## 2023-02-16 MED ORDER — BENZONATATE 200 MG PO CAPS
200.0000 mg | ORAL_CAPSULE | Freq: Three times a day (TID) | ORAL | 0 refills | Status: DC | PRN
  Filled 2023-02-16: qty 21, 7d supply, fill #0

## 2023-02-17 ENCOUNTER — Other Ambulatory Visit (HOSPITAL_BASED_OUTPATIENT_CLINIC_OR_DEPARTMENT_OTHER): Payer: Self-pay

## 2023-03-13 DIAGNOSIS — H2513 Age-related nuclear cataract, bilateral: Secondary | ICD-10-CM | POA: Diagnosis not present

## 2023-03-13 DIAGNOSIS — H5213 Myopia, bilateral: Secondary | ICD-10-CM | POA: Diagnosis not present

## 2023-03-13 DIAGNOSIS — H02834 Dermatochalasis of left upper eyelid: Secondary | ICD-10-CM | POA: Diagnosis not present

## 2023-03-13 DIAGNOSIS — H02831 Dermatochalasis of right upper eyelid: Secondary | ICD-10-CM | POA: Diagnosis not present

## 2023-03-29 ENCOUNTER — Other Ambulatory Visit (HOSPITAL_BASED_OUTPATIENT_CLINIC_OR_DEPARTMENT_OTHER): Payer: Self-pay

## 2023-04-19 ENCOUNTER — Other Ambulatory Visit (HOSPITAL_BASED_OUTPATIENT_CLINIC_OR_DEPARTMENT_OTHER): Payer: Self-pay

## 2023-04-24 ENCOUNTER — Other Ambulatory Visit (HOSPITAL_BASED_OUTPATIENT_CLINIC_OR_DEPARTMENT_OTHER): Payer: Self-pay

## 2023-05-12 ENCOUNTER — Other Ambulatory Visit (HOSPITAL_BASED_OUTPATIENT_CLINIC_OR_DEPARTMENT_OTHER): Payer: Self-pay

## 2023-05-12 MED ORDER — COVID-19 MRNA VAC-TRIS(PFIZER) 30 MCG/0.3ML IM SUSY
0.3000 mL | PREFILLED_SYRINGE | Freq: Once | INTRAMUSCULAR | 0 refills | Status: AC
  Filled 2023-05-12: qty 0.3, 1d supply, fill #0

## 2023-05-15 ENCOUNTER — Other Ambulatory Visit: Payer: Self-pay | Admitting: Internal Medicine

## 2023-05-15 ENCOUNTER — Telehealth: Payer: Self-pay

## 2023-05-15 DIAGNOSIS — E039 Hypothyroidism, unspecified: Secondary | ICD-10-CM

## 2023-05-15 NOTE — Telephone Encounter (Signed)
Patient feels like her thyroid is off and would like to have labs checked to see if she is on the right dose of medication.

## 2023-05-16 NOTE — Telephone Encounter (Signed)
Patient has been scheduled

## 2023-05-18 ENCOUNTER — Other Ambulatory Visit: Payer: Medicare HMO

## 2023-05-23 ENCOUNTER — Other Ambulatory Visit: Payer: Medicare HMO

## 2023-06-27 ENCOUNTER — Encounter: Payer: Self-pay | Admitting: Internal Medicine

## 2023-06-27 ENCOUNTER — Ambulatory Visit: Payer: Medicare HMO | Admitting: Internal Medicine

## 2023-06-27 VITALS — BP 134/74 | HR 68 | Ht 61.5 in | Wt 92.2 lb

## 2023-06-27 DIAGNOSIS — E559 Vitamin D deficiency, unspecified: Secondary | ICD-10-CM

## 2023-06-27 DIAGNOSIS — L659 Nonscarring hair loss, unspecified: Secondary | ICD-10-CM | POA: Diagnosis not present

## 2023-06-27 DIAGNOSIS — E039 Hypothyroidism, unspecified: Secondary | ICD-10-CM

## 2023-06-27 NOTE — Patient Instructions (Signed)
You may try " hair, skin and nails" vitamin. Hold It 2-3 days before any future thyroid blood work please

## 2023-06-27 NOTE — Progress Notes (Unsigned)
Name: Katelyn Anthony  MRN/ DOB: [id number], [birth year]    Age/ Sex: 77 y.o., female     PCP: Pcp, No   Reason for Endocrinology Evaluation: Hypothyroidism     Initial Endocrinology Clinic Visit: 11/26/2019    PATIENT IDENTIFIER: Katelyn Anthony is a 77 y.o., female with a past medical history of  hypothyroidism, dyslipidemia and MVP. She has followed with [facility] since 11/26/2019  for consultative assistance with management of her Hypothyroidism.   HISTORICAL SUMMARY:  She has been diagnosed with hypothyroidism since 1990'. She uses brand only .   SUBJECTIVE:    Today (06/27/2023):  Katelyn Anthony is here for hypothyroidism.    Developed neuropathy of the left foot in 09/2020  for following a callus removal that she attributes to the acid application.  She tried Lyrica for foot pain, but caused abdominal pain through Podiatry   Gabapentin caused fatigue and constipation .  She continues to follow-up with [facility] podiatry, symptoms have been improving   Weight has been fluctuating She had recent dental implants Denies loose stools or diarrhea , she is on Senokot for  constipation Denies palpitation Denies local neck symptoms  Has noted hair loss and dry skin  Levothyroxine 75 mcg , HALF a tablet on Sundays and 1 tablet the rest of the week MVI daily   HISTORY:  Past Medical History:  Past Medical History:  Diagnosis Date   Allergy    feathers   Cataract    Cholelithiasis with cholecystitis    Chronic   Hypothyroidism    MVP (mitral valve prolapse)    Osteopenia    Pneumonia    Thyroid disease    Past Surgical History:  Past Surgical History:  Procedure Laterality Date   ABDOMINAL HYSTERECTOMY     APPENDECTOMY     BREAST BIOPSY Left 05/17/2017   CHOLECYSTECTOMY N/A 01/10/2018   Procedure: LAPAROSCOPIC CHOLECYSTECTOMY WITH INTRAOPERATIVE CHOLANGIOGRAM;  Surgeon: Glenna Fellows, MD;  Location: [facility] OR;  Service: General;  Laterality: N/A;   COLON  SURGERY     diverticulitis   COLONOSCOPY WITH PROPOFOL N/A 03/09/2016   Procedure: COLONOSCOPY WITH PROPOFOL;  Surgeon: Willis Modena, MD;  Location: [facility] ENDOSCOPY;  Service: Endoscopy;  Laterality: N/A;   TONSILLECTOMY AND ADENOIDECTOMY     Social History:  reports that she has quit smoking. Her smoking use included cigarettes. She has never used smokeless tobacco. She reports current alcohol use of about 2.0 standard drinks of alcohol per week. She reports that she does not use drugs. Family History:  Family History  Problem Relation Age of Onset   Colon cancer Mother      HOME MEDICATIONS: Allergies as of 06/27/2023       Reactions   Other    Other reaction(s): unknown   Amoxicillin-pot Clavulanate Diarrhea    muscle pain, can tolerate short durations of amoxicillin  Has patient had a PCN reaction causing immediate rash, facial/tongue/throat swelling, SOB or lightheadedness with hypotension: No Has patient had a PCN reaction causing severe rash involving mucus membranes or skin necrosis: No Has patient had a PCN reaction that required hospitalization: Unknown Has patient had a PCN reaction occurring within the last 10 years: Unknown   Azithromycin Hives   Levofloxacin Other (See Comments)   REACTION: itchy skin and bloody stools   Naproxen Sodium Rash   Sulfonamide Derivatives Other (See Comments)   combative        Medication List  Accurate as of June 27, 2023  3:22 PM. If you have any questions, ask your nurse or doctor.          amoxicillin 500 MG capsule Commonly known as: AMOXIL Take 4 capsules (2,000 mg total) by mouth 1 hour prior to dental appointment. What changed: Another medication with the same name was removed. Continue taking this medication, and follow the directions you see here. Changed by: Johnney Ou Tiny Rietz   benzonatate 200 MG capsule Commonly known as: TESSALON Take 1 capsule (200 mg total) by mouth 3 (three) times daily as  needed  for cough for up to 7 days.   estradiol 0.1 MG/24HR patch Commonly known as: VIVELLE-DOT Place 1 patch onto the skin once a week. What changed: Another medication with the same name was removed. Continue taking this medication, and follow the directions you see here. Changed by: Johnney Ou Maurisio Ruddy   Fluad Quadrivalent 0.5 ML injection Generic drug: influenza vaccine adjuvanted Inject into the muscle.   Fluad Quadrivalent 0.5 ML injection Generic drug: influenza vaccine adjuvanted Inject into the muscle.   meloxicam 7.5 MG tablet Commonly known as: Mobic Take 1 tablet (7.5 mg total) by mouth daily.   Pfizer COVID-19 Vac Bivalent injection Generic drug: COVID-19 mRNA bivalent vaccine Proofreader) Inject into the muscle.   Pfizer-BioNT COVID-19 Vac-TriS Susp injection Generic drug: COVID-19 mRNA Vac-TriS (Pfizer) Inject into the muscle.   Comirnaty syringe Generic drug: COVID-19 mRNA vaccine (Pfizer) Inject into the muscle.   pregabalin 25 MG capsule Commonly known as: Lyrica Take 1 capsule by mouth twice a day   Shingrix injection Generic drug: Zoster Vaccine Adjuvanted Inject into the muscle.   Shingrix injection Generic drug: Zoster Vaccine Adjuvanted Inject into the muscle.   Synthroid 75 MCG tablet Generic drug: levothyroxine Take 1 tablet (75 mcg total) by mouth as directed. Half a tablet on Sundays, 1 tablet rest of the week          OBJECTIVE:   PHYSICAL EXAM: VS: BP 134/74   Pulse 68   Ht 5' 1.5" (1.562 m)   Wt 92 lb 3.2 oz (41.8 kg)   SpO2 94%   BMI 17.14 kg/m    EXAM: General: Pt appears well and is in NAD  Neck: General: Supple without adenopathy. Thyroid: Thyroid size normal.  No goiter or nodules appreciated.   Lungs: Clear with good BS bilat   Heart: Auscultation: RRR.  Abdomen:  soft, nontender  Extremities:  BL LE: No pretibial edema   Mental Status: Judgment, insight: Intact Orientation: Oriented to time, place, and  person Mood and affect: No depression, anxiety, or agitation     DATA REVIEWED: ***  ASSESSMENT / PLAN / RECOMMENDATIONS:   Hypothyroidism :   - She is clinically euthyroid  - TSh normal  - No changes   Medications   Continue Levothyroxine 75 mcg , HALF a tablet on Sundays and 1 tablet the rest of the week   2. Vitamin D insufficiency:   3. Hair loss/dry skin:  -Encourage regular moisturizing -Patient to try biotin, she was advised to hold it 2-3 days prior to any future thyroid workup  F/U in 1 yr   Signed electronically by: Lyndle Herrlich, MD  [facility]  [facility] [address] [place] [place], [place] [address] Phone: [phone] FAX: [phone]      CC: Pcp, No No address on file Phone: None  Fax: None   Return to Endocrinology clinic as below:  No future appointments.

## 2023-06-28 ENCOUNTER — Other Ambulatory Visit (HOSPITAL_BASED_OUTPATIENT_CLINIC_OR_DEPARTMENT_OTHER): Payer: Self-pay

## 2023-06-28 DIAGNOSIS — L659 Nonscarring hair loss, unspecified: Secondary | ICD-10-CM | POA: Insufficient documentation

## 2023-06-28 LAB — VITAMIN B12: Vitamin B-12: 754 pg/mL (ref 200–1100)

## 2023-06-28 LAB — TSH: TSH: 2.47 m[IU]/L (ref 0.40–4.50)

## 2023-06-28 LAB — T4, FREE: Free T4: 1.3 ng/dL (ref 0.8–1.8)

## 2023-06-28 LAB — VITAMIN D 25 HYDROXY (VIT D DEFICIENCY, FRACTURES): Vit D, 25-Hydroxy: 42 ng/mL (ref 30–100)

## 2023-06-28 MED ORDER — SYNTHROID 75 MCG PO TABS
75.0000 ug | ORAL_TABLET | ORAL | 3 refills | Status: DC
  Filled 2023-06-28 – 2023-07-27 (×2): qty 85, 85d supply, fill #0
  Filled 2023-10-15: qty 85, 85d supply, fill #1
  Filled 2024-01-09 – 2024-01-19 (×2): qty 85, 85d supply, fill #2
  Filled 2024-04-09: qty 85, 85d supply, fill #3

## 2023-07-27 ENCOUNTER — Other Ambulatory Visit (HOSPITAL_BASED_OUTPATIENT_CLINIC_OR_DEPARTMENT_OTHER): Payer: Self-pay

## 2023-07-27 MED ORDER — FLUAD 0.5 ML IM SUSY
0.5000 mL | PREFILLED_SYRINGE | Freq: Once | INTRAMUSCULAR | 0 refills | Status: AC
  Filled 2023-07-27: qty 0.5, 1d supply, fill #0

## 2023-08-01 ENCOUNTER — Other Ambulatory Visit (HOSPITAL_BASED_OUTPATIENT_CLINIC_OR_DEPARTMENT_OTHER): Payer: Self-pay

## 2023-08-02 ENCOUNTER — Other Ambulatory Visit (HOSPITAL_BASED_OUTPATIENT_CLINIC_OR_DEPARTMENT_OTHER): Payer: Self-pay

## 2023-08-02 ENCOUNTER — Other Ambulatory Visit (HOSPITAL_COMMUNITY): Payer: Self-pay

## 2023-08-02 MED ORDER — AMOXICILLIN 500 MG PO CAPS
2000.0000 mg | ORAL_CAPSULE | Freq: Once | ORAL | 2 refills | Status: AC
  Filled 2023-08-02: qty 16, 4d supply, fill #0
  Filled 2024-01-09 – 2024-01-19 (×2): qty 16, 4d supply, fill #1
  Filled 2024-05-23: qty 16, 4d supply, fill #2

## 2023-08-08 ENCOUNTER — Other Ambulatory Visit (HOSPITAL_BASED_OUTPATIENT_CLINIC_OR_DEPARTMENT_OTHER): Payer: Self-pay

## 2023-08-11 ENCOUNTER — Other Ambulatory Visit (HOSPITAL_BASED_OUTPATIENT_CLINIC_OR_DEPARTMENT_OTHER): Payer: Self-pay

## 2023-08-22 ENCOUNTER — Ambulatory Visit: Payer: Medicare HMO | Admitting: Internal Medicine

## 2023-08-30 ENCOUNTER — Ambulatory Visit: Payer: Medicare HMO | Admitting: Family Medicine

## 2023-09-22 ENCOUNTER — Encounter: Payer: Self-pay | Admitting: Nurse Practitioner

## 2023-09-27 ENCOUNTER — Ambulatory Visit: Payer: Medicare HMO | Admitting: Nurse Practitioner

## 2023-09-27 VITALS — BP 140/70 | HR 67 | Temp 98.3°F | Ht 61.0 in | Wt 92.4 lb

## 2023-09-27 DIAGNOSIS — E785 Hyperlipidemia, unspecified: Secondary | ICD-10-CM | POA: Diagnosis not present

## 2023-09-27 DIAGNOSIS — I10 Essential (primary) hypertension: Secondary | ICD-10-CM

## 2023-09-27 DIAGNOSIS — R5383 Other fatigue: Secondary | ICD-10-CM | POA: Diagnosis not present

## 2023-09-27 DIAGNOSIS — Z23 Encounter for immunization: Secondary | ICD-10-CM | POA: Diagnosis not present

## 2023-09-27 DIAGNOSIS — E559 Vitamin D deficiency, unspecified: Secondary | ICD-10-CM | POA: Diagnosis not present

## 2023-09-27 DIAGNOSIS — L853 Xerosis cutis: Secondary | ICD-10-CM

## 2023-09-27 MED ORDER — PNEUMOCOCCAL 20-VAL CONJ VACC 0.5 ML IM SUSY
0.5000 mL | PREFILLED_SYRINGE | Freq: Once | INTRAMUSCULAR | 0 refills | Status: AC
Start: 2023-09-27 — End: 2023-09-27

## 2023-09-27 NOTE — Assessment & Plan Note (Signed)
 Labs ordered, further recommendations may be made based upon his results.

## 2023-09-27 NOTE — Assessment & Plan Note (Signed)
 Chronic Patient to follow-up in 6 weeks for close monitoring, blood pressure remains elevated consider antihypertensive.  We also discussed that she could consider getting at home blood pressure monitor and keep a log of readings to determine whether or not she has high blood pressure or may be just whitecoat syndrome.  She reports her understanding.

## 2023-09-27 NOTE — Assessment & Plan Note (Signed)
 Chronic Referral to plastic surgery services to work with aesthetician

## 2023-09-27 NOTE — Progress Notes (Signed)
 New Patient Office Visit  Subjective    Patient ID: Katelyn Anthony, female    DOB: 1946-06-13  Age: 78 y.o. MRN: [id number]  CC:  Chief Complaint  Patient presents with   New Patient (Initial Visit)    Want to talk about, want a referral to estheticianfor dry skin  Want blood work for vitamin, kidney function     HPI Katelyn Anthony presents to establish care Her main concern is she would like to have Prevnar 20 administered.  She is already had Prevnar 13 and Pneumovax 23.  She reports history of pneumonia which she was quite sick about 15 years ago.  Last pneumonia vaccine administered in 2018. She would also like referral to aesthetician for evaluation and treatment of dry skin. She reports bilateral calcium deposits in each breast, she reports that she was told to have annual mammograms for monitoring as well as to consider biopsy.  She has declined biopsy at this time and is not sure if she wants to continue with annual mammograms.  She is aware that not having biopsy and not undergoing mammogram could result in missed cancer diagnosis which could have poor outcomes including death.   She follows up with gastroenterology routinely as she has a history of perforated diverticula requiring partial colectomy.  She is concerned that she may be deficient in some micronutrients. She is concerned that she has had elevated blood pressure readings at doctor's offices the last couple of years and would like this to be evaluated and treated if possible.    Outpatient Encounter Medications as of 09/27/2023  Medication Sig   amoxicillin (AMOXIL) 500 MG capsule Take 4 capsules (2,000 mg total) by mouth 1 hour prior to dental appointment.   estradiol (VIVELLE-DOT) 0.1 MG/24HR patch Place 1 patch onto the skin once a week.   pneumococcal 20-valent conjugate vaccine (PREVNAR 20) 0.5 ML injection Inject 0.5 mLs into the muscle once for 1 dose.   SYNTHROID 75 MCG tablet Take 1 tablet (75 mcg total) by  mouth as directed. Half a tablet on Sundays, 1 tablet rest of the week   [DISCONTINUED] benzonatate (TESSALON) 200 MG capsule Take 1 capsule (200 mg total) by mouth 3 (three) times daily as needed  for cough for up to 7 days.   [DISCONTINUED] COVID-19 mRNA bivalent vaccine, Pfizer, injection Inject into the muscle.   [DISCONTINUED] COVID-19 mRNA Vac-TriS, Pfizer, (PFIZER-BIONT COVID-19 VAC-TRIS) SUSP injection Inject into the muscle.   [DISCONTINUED] COVID-19 mRNA vaccine 2023-2024 (COMIRNATY) syringe Inject into the muscle.   [DISCONTINUED] influenza vaccine adjuvanted (FLUAD QUADRIVALENT) 0.5 ML injection Inject into the muscle.   [DISCONTINUED] influenza vaccine adjuvanted (FLUAD) 0.5 ML injection Inject into the muscle.   [DISCONTINUED] meloxicam (MOBIC) 7.5 MG tablet Take 1 tablet (7.5 mg total) by mouth daily.   [DISCONTINUED] pregabalin (LYRICA) 25 MG capsule Take 1 capsule by mouth twice a day   [DISCONTINUED] Zoster Vaccine Adjuvanted [facility]) injection Inject into the muscle.   [DISCONTINUED] Zoster Vaccine Adjuvanted [facility]) injection Inject into the muscle.   No facility-administered encounter medications on file as of 09/27/2023.    Past Medical History:  Diagnosis Date   Allergy    feathers   Cataract    Cholelithiasis with cholecystitis    Chronic   Hypothyroidism    MVP (mitral valve prolapse)    Osteopenia    Pneumonia    Thyroid disease     Past Surgical History:  Procedure Laterality Date   ABDOMINAL HYSTERECTOMY  APPENDECTOMY     BREAST BIOPSY Left 05/17/2017   CHOLECYSTECTOMY N/A 01/10/2018   Procedure: LAPAROSCOPIC CHOLECYSTECTOMY WITH INTRAOPERATIVE CHOLANGIOGRAM;  Surgeon: Glenna Fellows, MD;  Location: [facility] OR;  Service: General;  Laterality: N/A;   COLON SURGERY     diverticulitis   COLONOSCOPY WITH PROPOFOL N/A 03/09/2016   Procedure: COLONOSCOPY WITH PROPOFOL;  Surgeon: Willis Modena, MD;  Location: [facility] ENDOSCOPY;  Service: Endoscopy;   Laterality: N/A;   TONSILLECTOMY AND ADENOIDECTOMY      Family History  Problem Relation Age of Onset   Colon cancer Mother     Social History   Socioeconomic History   Marital status: Single    Spouse name: Not on file   Number of children: Not on file   Years of education: Not on file   Highest education level: Some college, no degree  Occupational History   Not on file  Tobacco Use   Smoking status: Former    Types: Cigarettes   Smokeless tobacco: Never  Vaping Use   Vaping status: Never Used  Substance and Sexual Activity   Alcohol use: Yes    Alcohol/week: 2.0 standard drinks of alcohol    Types: 2 Glasses of wine per week    Comment: 1.5- 2 glasses of wine daily   Drug use: No   Sexual activity: Not on file  Other Topics Concern   Not on file  Social History Narrative   Not on file   Social Drivers of Health   Financial Resource Strain: Low Risk  (09/22/2023)   Overall Financial Resource Strain (CARDIA)    Difficulty of Paying Living Expenses: Not hard at all  Food Insecurity: No Food Insecurity (09/22/2023)   Hunger Vital Sign    Worried About Running Out of Food in the Last Year: Never true    Ran Out of Food in the Last Year: Never true  Transportation Needs: No Transportation Needs (09/22/2023)   PRAPARE - Administrator, Civil Service (Medical): No    Lack of Transportation (Non-Medical): No  Physical Activity: Sufficiently Active (09/22/2023)   Exercise Vital Sign    Days of Exercise per Week: 3 days    Minutes of Exercise per Session: 60 min  Stress: No Stress Concern Present (09/22/2023)   [employer] of Occupational Health - Occupational Stress Questionnaire    Feeling of Stress : Only a little  Social Connections: Socially Isolated (09/22/2023)   Social Connection and Isolation Panel [NHANES]    Frequency of Communication with Friends and Family: Once a week    Frequency of Social Gatherings with Friends and Family: Once a week     Attends Religious Services: 1 to 4 times per year    Active Member of [employer] or Organizations: No    Attends Engineer, structural: Not on file    Marital Status: Divorced  Intimate Partner Violence: Not on file    Review of Systems  Respiratory:  Negative for shortness of breath.   Cardiovascular:  Negative for chest pain.        Objective    BP (!) 140/70   Pulse 67   Temp 98.3 F (36.8 C) (Temporal)   Ht 5\' 1"  (1.549 m)   Wt 92 lb 6 oz (41.9 kg)   BMI 17.45 kg/m   Physical Exam Vitals reviewed.  Constitutional:      General: She is not in acute distress.    Appearance: Normal appearance.  HENT:  Head: Normocephalic and atraumatic.  Neck:     Vascular: No carotid bruit.  Cardiovascular:     Rate and Rhythm: Normal rate and regular rhythm.     Pulses: Normal pulses.     Heart sounds: Normal heart sounds.  Pulmonary:     Effort: Pulmonary effort is normal.     Breath sounds: Normal breath sounds.  Skin:    General: Skin is warm and dry.  Neurological:     General: No focal deficit present.     Mental Status: She is alert and oriented to person, place, and time.  Psychiatric:        Mood and Affect: Mood normal.        Behavior: Behavior normal.        Judgment: Judgment normal.         Assessment & Plan:   Problem List Items Addressed This Visit       Cardiovascular and Mediastinum   Hypertension - Primary   Chronic Patient to follow-up in 6 weeks for close monitoring, blood pressure remains elevated consider antihypertensive.  We also discussed that she could consider getting at home blood pressure monitor and keep a log of readings to determine whether or not she has high blood pressure or may be just whitecoat syndrome.  She reports her understanding.      Relevant Orders   CBC     Musculoskeletal and Integument   Dry skin   Chronic Referral to plastic surgery services to work with aesthetician      Relevant Orders    Ambulatory referral to Plastic Surgery     Other   Hyperlipidemia   Labs ordered, further recommendations may be made based upon his results       Relevant Orders   Lipid panel   Vitamin D deficiency   Labs ordered, further recommendations may be made based upon his results       Relevant Orders   Comprehensive metabolic panel   VITAMIN D 25 Hydroxy (Vit-D Deficiency, Fractures)   Need for pneumococcal vaccination   Administer the prevnar 20 today, VIS provided      Relevant Medications   pneumococcal 20-valent conjugate vaccine (PREVNAR 20) 0.5 ML injection   Fatigue   Labs ordered, further recommendations may be made based upon his results       Relevant Orders   CBC   Comprehensive metabolic panel   Lipid panel   VITAMIN D 25 Hydroxy (Vit-D Deficiency, Fractures)   Vitamin B12   Magnesium   Zinc   Iron   Ferritin    Return in about 6 weeks (around 11/08/2023) for F/U with Maralyn Sago - allow 40 minutes.   Elenore Paddy, NP

## 2023-09-27 NOTE — Assessment & Plan Note (Signed)
 Administer the prevnar 20 today, VIS provided

## 2023-09-28 ENCOUNTER — Other Ambulatory Visit: Payer: Self-pay | Admitting: Nurse Practitioner

## 2023-09-28 DIAGNOSIS — L853 Xerosis cutis: Secondary | ICD-10-CM

## 2023-10-02 ENCOUNTER — Encounter: Payer: Self-pay | Admitting: Nurse Practitioner

## 2023-10-02 ENCOUNTER — Other Ambulatory Visit (INDEPENDENT_AMBULATORY_CARE_PROVIDER_SITE_OTHER)

## 2023-10-02 ENCOUNTER — Telehealth: Payer: Self-pay

## 2023-10-02 DIAGNOSIS — E559 Vitamin D deficiency, unspecified: Secondary | ICD-10-CM

## 2023-10-02 DIAGNOSIS — R5383 Other fatigue: Secondary | ICD-10-CM

## 2023-10-02 DIAGNOSIS — I10 Essential (primary) hypertension: Secondary | ICD-10-CM | POA: Diagnosis not present

## 2023-10-02 DIAGNOSIS — E785 Hyperlipidemia, unspecified: Secondary | ICD-10-CM | POA: Diagnosis not present

## 2023-10-02 LAB — COMPREHENSIVE METABOLIC PANEL
ALT: 13 U/L (ref 0–35)
AST: 21 U/L (ref 0–37)
Albumin: 4.1 g/dL (ref 3.5–5.2)
Alkaline Phosphatase: 79 U/L (ref 39–117)
BUN: 14 mg/dL (ref 6–23)
CO2: 28 meq/L (ref 19–32)
Calcium: 9.2 mg/dL (ref 8.4–10.5)
Chloride: 105 meq/L (ref 96–112)
Creatinine, Ser: 0.72 mg/dL (ref 0.40–1.20)
GFR: 80.37 mL/min (ref >60.00)
Glucose, Bld: 100 mg/dL — ABNORMAL HIGH (ref 70–99)
Potassium: 4 meq/L (ref 3.5–5.1)
Sodium: 139 meq/L (ref 135–145)
Total Bilirubin: 0.4 mg/dL (ref 0.2–1.2)
Total Protein: 6.3 g/dL (ref 6.0–8.3)

## 2023-10-02 LAB — CBC
HCT: 40.5 % (ref 36.0–46.0)
Hemoglobin: 13.6 g/dL (ref 12.0–15.0)
MCHC: 33.5 g/dL (ref 30.0–36.0)
MCV: 92 fl (ref 78.0–100.0)
Platelets: 284 10*3/uL (ref 150.0–400.0)
RBC: 4.4 Mil/uL (ref 3.87–5.11)
RDW: 13 % (ref 11.5–15.5)
WBC: 5.7 10*3/uL (ref 4.0–10.5)

## 2023-10-02 LAB — LIPID PANEL
Cholesterol: 158 mg/dL (ref 0–200)
HDL: 59.6 mg/dL (ref >39.00)
LDL Cholesterol: 80 mg/dL (ref 0–99)
NonHDL: 98.88
Total CHOL/HDL Ratio: 3
Triglycerides: 94 mg/dL (ref 0.0–149.0)
VLDL: 18.8 mg/dL (ref 0.0–40.0)

## 2023-10-02 LAB — MAGNESIUM: Magnesium: 2 mg/dL (ref 1.5–2.5)

## 2023-10-02 LAB — FERRITIN: Ferritin: 29.4 ng/mL (ref 10.0–291.0)

## 2023-10-02 LAB — IRON: Iron: 34 ug/dL — ABNORMAL LOW (ref 42–145)

## 2023-10-02 LAB — VITAMIN D 25 HYDROXY (VIT D DEFICIENCY, FRACTURES): VITD: 26.57 ng/mL — ABNORMAL LOW (ref 30.00–100.00)

## 2023-10-02 LAB — VITAMIN B12: Vitamin B-12: 453 pg/mL (ref 211–911)

## 2023-10-02 NOTE — Telephone Encounter (Signed)
 Copied from CRM [id number]. Topic: Clinical - Lab/Test Results >> Oct 02, 2023  4:37 PM Florestine Avers wrote: Reason for CRM: Patient called in stating that her iron came back low, and her vitamin D as well as her glucose and she wants to know what she should do next. Patient is requesting a call back or a response through MyChart.

## 2023-10-04 LAB — ZINC: Zinc: 65 ug/dL (ref 60–130)

## 2023-10-05 ENCOUNTER — Encounter: Payer: Self-pay | Admitting: Nurse Practitioner

## 2023-10-06 NOTE — Telephone Encounter (Signed)
 Left detail message for pt that once her provider takes a look at her labs, she will be called about the result

## 2023-10-08 ENCOUNTER — Encounter: Payer: Self-pay | Admitting: Emergency Medicine

## 2023-10-08 ENCOUNTER — Ambulatory Visit
Admission: EM | Admit: 2023-10-08 | Discharge: 2023-10-08 | Disposition: A | Discharge status: Home / Self Care | Attending: Emergency Medicine | Admitting: Emergency Medicine

## 2023-10-08 DIAGNOSIS — R0981 Nasal congestion: Secondary | ICD-10-CM | POA: Diagnosis not present

## 2023-10-08 DIAGNOSIS — K5900 Constipation, unspecified: Secondary | ICD-10-CM | POA: Diagnosis not present

## 2023-10-08 DIAGNOSIS — J029 Acute pharyngitis, unspecified: Secondary | ICD-10-CM

## 2023-10-08 MED ORDER — FLUTICASONE PROPIONATE 50 MCG/ACT NA SUSP: 1.0000 | Freq: Every day | NASAL | 2 refills | Status: DC

## 2023-10-08 MED ORDER — GUAIFENESIN ER 600 MG PO TB12: 600.0000 mg | ORAL_TABLET | Freq: Two times a day (BID) | ORAL | 0 refills | Status: AC

## 2023-10-08 MED ORDER — DOCUSATE SODIUM 100 MG PO CAPS: 100.0000 mg | ORAL_CAPSULE | Freq: Two times a day (BID) | ORAL | 0 refills | Status: AC

## 2023-10-08 NOTE — ED Triage Notes (Signed)
 Pt c/o sinus pressure sinus Thursday. She think it may also be due to reaction to Prevnar medication

## 2023-10-08 NOTE — ED Provider Notes (Signed)
 [facility]    CSN: [id number] Arrival date & time: 10/08/23  1545      History   Chief Complaint Chief Complaint  Patient presents with   Facial Pain    HPI Katelyn Anthony is a 78 y.o. female.   Patient presents to clinic with multiple concerns.  She has had nasal congestion and a sore throat since Thursday.  She did get a Prevnar vaccination earlier in March.  She had dental implants in January.  She has also been constipated since starting an over-the-counter iron supplement for her low iron.  Takes Senokot daily.  Has not had any fevers.  Did initially have a mild cough, this has resolved.  Without wheezing or shortness of breath.    The history is provided by the patient and medical records.    Past Medical History:  Diagnosis Date   Allergy    feathers   Cataract    Cholelithiasis with cholecystitis    Chronic   Hypothyroidism    MVP (mitral valve prolapse)    Osteopenia    Pneumonia    Thyroid disease     Patient Active Problem List   Diagnosis Date Noted   Dry skin 09/27/2023   Hypertension 09/27/2023   Need for pneumococcal vaccination 09/27/2023   Fatigue 09/27/2023   Hair loss 06/28/2023   Nerve damage of left foot 10/19/2022   Vitamin D insufficiency 06/24/2022   Impacted cerumen of left ear 04/05/2022   Nasal dryness 04/05/2022   Plantar callus 03/15/2022   Neuropathy of left foot 06/29/2021   Plantar fat pad atrophy of left foot 02/01/2021   Arthritis 10/08/2020   Chronic fatigue syndrome 10/08/2020   Constipation 10/08/2020   Family history of colonic polyps 10/08/2020   Hyperlipidemia 10/08/2020   Hypomagnesemia 10/08/2020   Menopause 10/08/2020   Mitral valve prolapse 10/08/2020   Osteoporosis 10/08/2020   Insomnia 10/08/2020   Vitamin D deficiency 10/08/2020   Metatarsalgia of left foot 08/25/2020   Acquired plantar porokeratosis 03/13/2020   Prominent metatarsal head of right foot 03/13/2020   Cholelithiasis and  cholecystitis without obstruction 01/10/2018   Myopia with astigmatism and presbyopia, bilateral 05/10/2017   Corn of toe 12/07/2016   Multinodular goiter 08/01/2013   History of colonic polyps 01/08/2010   PNEUMONIA 05/01/2009   DYSPNEA 05/01/2009   Nonspecific (abnormal) findings on radiological and other examination of body structure 05/01/2009   ABNORMAL CHEST XRAY 05/01/2009   Hypothyroidism 03/26/2009   MITRAL VALVE PROLAPSE 03/26/2009    Past Surgical History:  Procedure Laterality Date   ABDOMINAL HYSTERECTOMY     APPENDECTOMY     BREAST BIOPSY Left 05/17/2017   CHOLECYSTECTOMY N/A 01/10/2018   Procedure: LAPAROSCOPIC CHOLECYSTECTOMY WITH INTRAOPERATIVE CHOLANGIOGRAM;  Surgeon: Glenna Fellows, MD;  Location: [facility] OR;  Service: General;  Laterality: N/A;   COLON SURGERY     diverticulitis   COLONOSCOPY WITH PROPOFOL N/A 03/09/2016   Procedure: COLONOSCOPY WITH PROPOFOL;  Surgeon: Willis Modena, MD;  Location: [facility] ENDOSCOPY;  Service: Endoscopy;  Laterality: N/A;   TONSILLECTOMY AND ADENOIDECTOMY      OB History   No obstetric history on file.      Home Medications    Prior to Admission medications   Medication Sig Start Date End Date Taking? Authorizing Provider  docusate sodium (COLACE) 100 MG capsule Take 1 capsule (100 mg total) by mouth every 12 (twelve) hours. 10/08/23  Yes Rinaldo Ratel, [place] N, FNP  fluticasone [facility]) 50 MCG/ACT nasal spray Place  1 spray into both nostrils daily. 10/08/23  Yes Rinaldo Ratel, [place] N, FNP  guaiFENesin (MUCINEX) 600 MG 12 hr tablet Take 1 tablet (600 mg total) by mouth 2 (two) times daily for 7 days. 10/08/23 10/15/23 Yes Rinaldo Ratel, [place] N, FNP  amoxicillin (AMOXIL) 500 MG capsule Take 4 capsules (2,000 mg total) by mouth 1 hour prior to dental appointment. Patient not taking: Reported on 10/08/2023 02/21/22     estradiol (VIVELLE-DOT) 0.1 MG/24HR patch Place 1 patch onto the skin once a week. 05/18/20   [provider]   SYNTHROID 75 MCG tablet Take 1 tablet (75 mcg total) by mouth as directed. Half a tablet on Sundays, 1 tablet rest of the week 06/28/23   Shamleffer, Konrad Dolores, MD    Family History Family History  Problem Relation Age of Onset   Colon cancer Mother     Social History Social History   Tobacco Use   Smoking status: Former    Types: Cigarettes   Smokeless tobacco: Never  Vaping Use   Vaping status: Never Used  Substance Use Topics   Alcohol use: Yes    Alcohol/week: 2.0 standard drinks of alcohol    Types: 2 Glasses of wine per week    Comment: 1.5- 2 glasses of wine daily   Drug use: No     Allergies   Other, Amoxicillin-pot clavulanate, Azithromycin, Levofloxacin, Naproxen sodium, and Sulfonamide derivatives   Review of Systems Review of Systems  Per HPI   Physical Exam Triage Vital Signs ED Triage Vitals  Encounter Vitals Group     BP 10/08/23 1548 (!) 141/70     Systolic BP Percentile --      Diastolic BP Percentile --      Pulse Rate 10/08/23 1548 74     Resp 10/08/23 1548 17     Temp 10/08/23 1548 (!) 97.4 F (36.3 C)     Temp Source 10/08/23 1548 Oral     SpO2 10/08/23 1548 97 %     Weight --      Height --      Head Circumference --      Peak Flow --      Pain Score 10/08/23 1553 5     Pain Loc --      Pain Education --      Exclude from Growth Chart --    No data found.  Updated Vital Signs BP (!) 141/70 (BP Location: Right Arm)   Pulse 74   Temp (!) 97.4 F (36.3 C) (Oral)   Resp 17   SpO2 97%   Visual Acuity Right Eye Distance:   Left Eye Distance:   Bilateral Distance:    Right Eye Near:   Left Eye Near:    Bilateral Near:     Physical Exam Vitals and nursing note reviewed.  Constitutional:      Appearance: Normal appearance.  HENT:     Head: Normocephalic and atraumatic.     Right Ear: External ear normal.     Left Ear: External ear normal.     Nose: Congestion present.     Mouth/Throat:     Mouth: Mucous  membranes are moist.     Pharynx: Posterior oropharyngeal erythema present.  Eyes:     Conjunctiva/sclera: Conjunctivae normal.  Cardiovascular:     Rate and Rhythm: Normal rate and regular rhythm.     Heart sounds: Normal heart sounds. No murmur heard. Pulmonary:     Effort: Pulmonary effort is normal.  No respiratory distress.     Breath sounds: Normal breath sounds.  Musculoskeletal:        General: Normal range of motion.  Skin:    General: Skin is warm and dry.  Neurological:     General: No focal deficit present.     Mental Status: She is alert.  Psychiatric:        Mood and Affect: Mood normal.      UC Treatments / Results  Labs (all labs ordered are listed, but only abnormal results are displayed) Labs Reviewed - No data to display  EKG   Radiology No results found.  Procedures Procedures (including critical care time)  Medications Ordered in UC Medications - No data to display  Initial Impression / Assessment and Plan / UC Course  I have reviewed the triage vital signs and the nursing notes.  Pertinent labs & imaging results that were available during my care of the patient were reviewed by me and considered in my medical decision making (see chart for details).  Vitals and triage reviewed, patient is hemodynamically stable.  Sinuses without tenderness to palpation.  Congestion and postnasal drip on physical exam.  Reassured that symptoms are most likely viral versus allergic instead of bacterial.  Symptomatic management discussed.  Constipation management reviewed as well, discussed side effects of iron supplement.  Lungs are vesicular, heart with regular rate and rhythm.  Plan of care, follow-up care return precautions given, no questions at this time.    Final Clinical Impressions(s) / UC Diagnoses   Final diagnoses:  Nasal congestion  Constipation, unspecified constipation type  Acute pharyngitis, unspecified etiology     Discharge Instructions       For the nasal congestion you can use the Mucinex daily to help loosen secretions.  Ensure you are staying well-hydrated and sleeping with a humidifier may help as well.  If your symptoms are allergic.  Flonase may help, this is the nasal spray.  Unfortunately iron does have a side effect of constipation.  You can take the Colace twice daily.  For moderate to severe constipation (not having a bowel movement in more than 3 days) then try to use an enema or Miralax once daily until you have a good bowel movement.  It is not a good idea to use an enema or laxatives daily. If you find you are doing this, then please follow up with a gastroenterologist. Otherwise, a medication you could use daily to help with promoting bowel movements is docusate (Colace) 100mg . It is okay to use this 1-2 times daily as a stool softener.  Try to stay active physically including regular exercise 2-3 times a week.  Make sure you hydrate well every day with about 64 ounces of water daily (that is 2 liters).  Try to avoid carb heavy foods, dairy. This includes cutting out breads, pasta, pizza, pastries, potatoes, rice, starchy foods in general. Eat more fiber as listed below:  Salads - kale, spinach, cabbage, spring mix, arugula Fruits - avocadoes, berries (blueberries, raspberries, blackberries), apples, oranges, pomegranate, grapefruit, kiwi Vegetables - asparagus, cauliflower, broccoli, green beans, brussel sprouts, bell peppers, beets; stay away from or limit starchy vegetables like potatoes, carrots, peas Other general foods - kidney beans, egg whites, almonds, walnuts, sunflower seeds, pumpkin seeds, fat free yogurt, almond milk, flax seeds, quinoa, oats  Meat - It is better to eat lean meats and limit your red meat including pork to once a week.  Wild caught fish, chicken breast are  good options as they tend to be leaner sources of good protein. Still be mindful of the sodium labels for the meats you buy.  DO NOT EAT  ANY FOODS ON THIS LIST THAT YOU ARE ALLERGIC TO. For more specific needs, I highly recommend consulting a dietician or nutritionist but this can definitely be a good starting point.     ED Prescriptions     Medication Sig Dispense Auth. Provider   fluticasone (FLONASE) 50 MCG/ACT nasal spray Place 1 spray into both nostrils daily. 9.9 mL Rinaldo Ratel, [place] N, FNP   guaiFENesin (MUCINEX) 600 MG 12 hr tablet Take 1 tablet (600 mg total) by mouth 2 (two) times daily for 7 days. 14 tablet Rinaldo Ratel, [place] N, [name]   docusate sodium (COLACE) 100 MG capsule Take 1 capsule (100 mg total) by mouth every 12 (twelve) hours. 60 capsule Joshlynn Alfonzo, [place] N, [name]      PDMP not reviewed this encounter.   Ira Dougher, [place] N, [name] 10/08/23 [phone]

## 2023-10-08 NOTE — Discharge Instructions (Signed)
 For the nasal congestion you can use the Mucinex daily to help loosen secretions.  Ensure you are staying well-hydrated and sleeping with a humidifier may help as well.  If your symptoms are allergic.  Flonase may help, this is the nasal spray.  Unfortunately iron does have a side effect of constipation.  You can take the Colace twice daily.  For moderate to severe constipation (not having a bowel movement in more than 3 days) then try to use an enema or Miralax once daily until you have a good bowel movement.  It is not a good idea to use an enema or laxatives daily. If you find you are doing this, then please follow up with a gastroenterologist. Otherwise, a medication you could use daily to help with promoting bowel movements is docusate (Colace) 100mg . It is okay to use this 1-2 times daily as a stool softener.  Try to stay active physically including regular exercise 2-3 times a week.  Make sure you hydrate well every day with about 64 ounces of water daily (that is 2 liters).  Try to avoid carb heavy foods, dairy. This includes cutting out breads, pasta, pizza, pastries, potatoes, rice, starchy foods in general. Eat more fiber as listed below:  Salads - kale, spinach, cabbage, spring mix, arugula Fruits - avocadoes, berries (blueberries, raspberries, blackberries), apples, oranges, pomegranate, grapefruit, kiwi Vegetables - asparagus, cauliflower, broccoli, green beans, brussel sprouts, bell peppers, beets; stay away from or limit starchy vegetables like potatoes, carrots, peas Other general foods - kidney beans, egg whites, almonds, walnuts, sunflower seeds, pumpkin seeds, fat free yogurt, almond milk, flax seeds, quinoa, oats  Meat - It is better to eat lean meats and limit your red meat including pork to once a week.  Wild caught fish, chicken breast are good options as they tend to be leaner sources of good protein. Still be mindful of the sodium labels for the meats you buy.  DO NOT EAT ANY  FOODS ON THIS LIST THAT YOU ARE ALLERGIC TO. For more specific needs, I highly recommend consulting a dietician or nutritionist but this can definitely be a good starting point.

## 2023-10-11 ENCOUNTER — Other Ambulatory Visit: Payer: Self-pay

## 2023-10-11 ENCOUNTER — Ambulatory Visit
Admission: EM | Admit: 2023-10-11 | Discharge: 2023-10-11 | Disposition: A | Discharge status: Home / Self Care | Attending: Physician Assistant | Admitting: Physician Assistant

## 2023-10-11 DIAGNOSIS — J019 Acute sinusitis, unspecified: Secondary | ICD-10-CM | POA: Diagnosis not present

## 2023-10-11 MED ORDER — AMOXICILLIN-POT CLAVULANATE 875-125 MG PO TABS: 1.0000 | ORAL_TABLET | Freq: Two times a day (BID) | ORAL | 0 refills | Status: DC

## 2023-10-11 NOTE — ED Triage Notes (Signed)
 Pt presents with complaints of nasal congestion, sinus pressure, and lower back pain x 1 week. Pt states her co-worker had the same symptoms & received antibiotics. Pt reports she is needing antibiotics, was seen here on 3/16 with no improvement. Pt states she did not take the recommended nasal spray.

## 2023-10-11 NOTE — ED Provider Notes (Signed)
 [name]    CSN: [id number] Arrival date & time: 10/11/23  1538      History   Chief Complaint Chief Complaint  Patient presents with   Nasal Congestion    HPI Katelyn Anthony is a 78 y.o. female.   Patient here today for evaluation of nasal congestion and sinus pressure she has had for the last week.  She was seen earlier this week and states symptoms have not improved but have progressed to a more greenish colored sputum.  She has had some low back pain develop as well.  She was recommended to nasal spray at last visit but did not take medication.  She denies any fever.  The history is provided by the patient.    Past Medical History:  Diagnosis Date   Allergy    feathers   Cataract    Cholelithiasis with cholecystitis    Chronic   Hypothyroidism    MVP (mitral valve prolapse)    Osteopenia    Pneumonia    Thyroid disease     Patient Active Problem List   Diagnosis Date Noted   Dry skin 09/27/2023   Hypertension 09/27/2023   Need for pneumococcal vaccination 09/27/2023   Fatigue 09/27/2023   Hair loss 06/28/2023   Nerve damage of left foot 10/19/2022   Vitamin D insufficiency 06/24/2022   Impacted cerumen of left ear 04/05/2022   Nasal dryness 04/05/2022   Plantar callus 03/15/2022   Neuropathy of left foot 06/29/2021   Plantar fat pad atrophy of left foot 02/01/2021   Arthritis 10/08/2020   Chronic fatigue syndrome 10/08/2020   Constipation 10/08/2020   Family history of colonic polyps 10/08/2020   Hyperlipidemia 10/08/2020   Hypomagnesemia 10/08/2020   Menopause 10/08/2020   Mitral valve prolapse 10/08/2020   Osteoporosis 10/08/2020   Insomnia 10/08/2020   Vitamin D deficiency 10/08/2020   Metatarsalgia of left foot 08/25/2020   Acquired plantar porokeratosis 03/13/2020   Prominent metatarsal head of right foot 03/13/2020   Cholelithiasis and cholecystitis without obstruction 01/10/2018   Myopia with astigmatism and presbyopia,  bilateral 05/10/2017   Corn of toe 12/07/2016   Multinodular goiter 08/01/2013   History of colonic polyps 01/08/2010   PNEUMONIA 05/01/2009   DYSPNEA 05/01/2009   Nonspecific (abnormal) findings on radiological and other examination of body structure 05/01/2009   ABNORMAL CHEST XRAY 05/01/2009   Hypothyroidism 03/26/2009   MITRAL VALVE PROLAPSE 03/26/2009    Past Surgical History:  Procedure Laterality Date   ABDOMINAL HYSTERECTOMY     APPENDECTOMY     BREAST BIOPSY Left 05/17/2017   CHOLECYSTECTOMY N/A 01/10/2018   Procedure: LAPAROSCOPIC CHOLECYSTECTOMY WITH INTRAOPERATIVE CHOLANGIOGRAM;  Surgeon: Glenna Fellows, MD;  Location: [facility] OR;  Service: General;  Laterality: N/A;   COLON SURGERY     diverticulitis   COLONOSCOPY WITH PROPOFOL N/A 03/09/2016   Procedure: COLONOSCOPY WITH PROPOFOL;  Surgeon: Willis Modena, MD;  Location: [facility] ENDOSCOPY;  Service: Endoscopy;  Laterality: N/A;   TONSILLECTOMY AND ADENOIDECTOMY      OB History   No obstetric history on file.      Home Medications    Prior to Admission medications   Medication Sig Start Date End Date Taking? Authorizing Provider  amoxicillin-clavulanate (AUGMENTIN) 875-125 MG tablet Take 1 tablet by mouth every 12 (twelve) hours. 10/11/23  Yes Tomi Bamberger, PA-C  amoxicillin (AMOXIL) 500 MG capsule Take 4 capsules (2,000 mg total) by mouth 1 hour prior to dental appointment. Patient not taking: Reported on  10/08/2023 02/21/22     docusate sodium (COLACE) 100 MG capsule Take 1 capsule (100 mg total) by mouth every 12 (twelve) hours. 10/08/23   Garrison, [place] N, FNP  estradiol (VIVELLE-DOT) 0.1 MG/24HR patch Place 1 patch onto the skin once a week. 05/18/20   [provider]  fluticasone (FLONASE) 50 MCG/ACT nasal spray Place 1 spray into both nostrils daily. 10/08/23   Garrison, [place] N, FNP  guaiFENesin (MUCINEX) 600 MG 12 hr tablet Take 1 tablet (600 mg total) by mouth 2 (two) times daily for 7 days.  10/08/23 10/15/23  Garrison, [place] N, FNP  SYNTHROID 75 MCG tablet Take 1 tablet (75 mcg total) by mouth as directed. Half a tablet on Sundays, 1 tablet rest of the week 06/28/23   Shamleffer, Konrad Dolores, MD    Family History Family History  Problem Relation Age of Onset   Colon cancer Mother     Social History Social History   Tobacco Use   Smoking status: Former    Types: Cigarettes   Smokeless tobacco: Never  Vaping Use   Vaping status: Never Used  Substance Use Topics   Alcohol use: Yes    Alcohol/week: 2.0 standard drinks of alcohol    Types: 2 Glasses of wine per week    Comment: 1.5- 2 glasses of wine daily   Drug use: No     Allergies   Other, Amoxicillin-pot clavulanate, Azithromycin, Levofloxacin, Naproxen sodium, and Sulfonamide derivatives   Review of Systems Review of Systems  Constitutional:  Negative for chills and fever.  HENT:  Positive for congestion and sinus pressure. Negative for ear pain and sore throat.   Eyes:  Negative for discharge and redness.  Respiratory:  Negative for cough, shortness of breath and wheezing.   Gastrointestinal:  Negative for abdominal pain, diarrhea, nausea and vomiting.     Physical Exam Triage Vital Signs ED Triage Vitals  Encounter Vitals Group     BP      Systolic BP Percentile      Diastolic BP Percentile      Pulse      Resp      Temp      Temp src      SpO2      Weight      Height      Head Circumference      Peak Flow      Pain Score      Pain Loc      Pain Education      Exclude from Growth Chart    No data found.  Updated Vital Signs BP 137/69 (BP Location: Right Arm)   Pulse 82   Temp 98 F (36.7 C) (Oral)   Resp 18   Ht 5\' 1"  (1.549 m)   Wt 92 lb (41.7 kg)   SpO2 93%   BMI 17.38 kg/m   Visual Acuity Right Eye Distance:   Left Eye Distance:   Bilateral Distance:    Right Eye Near:   Left Eye Near:    Bilateral Near:     Physical Exam Vitals and nursing note reviewed.   Constitutional:      General: She is not in acute distress.    Appearance: Normal appearance. She is not ill-appearing.  HENT:     Head: Normocephalic and atraumatic.     Right Ear: Tympanic membrane normal.     Left Ear: Tympanic membrane normal.     Nose: Congestion present.  Mouth/Throat:     Mouth: Mucous membranes are moist.     Pharynx: No oropharyngeal exudate or posterior oropharyngeal erythema.  Eyes:     Conjunctiva/sclera: Conjunctivae normal.  Cardiovascular:     Rate and Rhythm: Normal rate and regular rhythm.     Heart sounds: Normal heart sounds. No murmur heard. Pulmonary:     Effort: Pulmonary effort is normal. No respiratory distress.     Breath sounds: Normal breath sounds. No wheezing, rhonchi or rales.  Skin:    General: Skin is warm and dry.  Neurological:     Mental Status: She is alert.  Psychiatric:        Mood and Affect: Mood normal.        Thought Content: Thought content normal.      UC Treatments / Results  Labs (all labs ordered are listed, but only abnormal results are displayed) Labs Reviewed - No data to display  EKG   Radiology No results found.  Procedures Procedures (including critical care time)  Medications Ordered in UC Medications - No data to display  Initial Impression / Assessment and Plan / UC Course  I have reviewed the triage vital signs and the nursing notes.  Pertinent labs & imaging results that were available during my care of the patient were reviewed by me and considered in my medical decision making (see chart for details).    Given duration of symptoms we will treat to cover sinusitis.  Augmentin prescribed as patient has tolerated amoxicillin in the past.  She does have intolerance noted in her allergy list- Augmentin has called diarrhea in the past- however given other allergies this would seem the most appropriate antibiotic choice at this time for sinus infection coverage.  Recommended she eat before  taking medication and advised follow-up if no gradual improvement with any worsening symptoms or further concerns.  Final Clinical Impressions(s) / UC Diagnoses   Final diagnoses:  Acute non-recurrent sinusitis, unspecified location   Discharge Instructions   None    ED Prescriptions     Medication Sig Dispense Auth. Provider   amoxicillin-clavulanate (AUGMENTIN) 875-125 MG tablet Take 1 tablet by mouth every 12 (twelve) hours. 14 tablet Tomi Bamberger, PA-C      PDMP not reviewed this encounter.   Tomi Bamberger, PA-C 10/11/23 1615

## 2023-10-12 ENCOUNTER — Telehealth: Payer: Self-pay

## 2023-10-12 NOTE — Telephone Encounter (Signed)
 Copied from CRM [id number]. Topic: General - Other >> Oct 12, 2023 10:23 AM Truddie Crumble wrote: Reason for CRM: patient called stating she took irons pills and they constipated her so she too two sinokot pills and now her bottom is sore. Patient stated she need a cream to get the soreness from her bottom to go away. Patient stated the Vaseline and diaper rash cream does not work

## 2023-10-16 ENCOUNTER — Ambulatory Visit: Payer: Self-pay | Admitting: Nurse Practitioner

## 2023-10-16 ENCOUNTER — Telehealth: Admitting: Family Medicine

## 2023-10-16 ENCOUNTER — Encounter: Payer: Self-pay | Admitting: Family Medicine

## 2023-10-16 ENCOUNTER — Ambulatory Visit: Payer: Self-pay

## 2023-10-16 DIAGNOSIS — N898 Other specified noninflammatory disorders of vagina: Secondary | ICD-10-CM

## 2023-10-16 NOTE — Telephone Encounter (Signed)
 Copied from CRM [id number]. Topic: Clinical - Medical Advice >> Oct 16, 2023  8:48 AM Florestine Avers wrote: Reason for CRM: Patient called in stating that she was prescribed a antibiotic that caused her to get a yeast infection. Patient states that her symptom is vaginal itching, she is requesting for a prescription to be call into [facility] in [facility]. She states she can not take over the counter monistat and would like something else prescribed. Patient is also requesting.   Patient was seen at urgent care 5 days ago for a sinus infection and was prescribed Augmentin. She states that she now has a yeast infection and would like an antibiotic. Patient has attempted to contact the urgent care without success. Patient states she would not be able to come in for an appointment for a prescription for the yeast infection and would like it called in to her pharmacy. Patient advised of 2-3 day turn around for prescription requests.   Patient would like a call back with a response to her request when able.    Reason for Disposition  [1] Caller has NON-URGENT question AND [2] triager unable to answer question  Answer Assessment - Initial Assessment Questions 1. INFECTION: "What infection is the antibiotic being given for?"     Sinus infection  2. ANTIBIOTIC: "What antibiotic are you taking" "How many times per day?"     Augmentin  3. DURATION: "When was the antibiotic started?"     10/11/23 4. MAIN CONCERN OR SYMPTOM:  "What is your main concern right now?"     Yeast infection  Protocols used: Infection on Antibiotic Follow-up Call-A-[facility]

## 2023-10-16 NOTE — Telephone Encounter (Signed)
 Called pt - virtual with Alona Bene made for this afternoon

## 2023-10-16 NOTE — Telephone Encounter (Signed)
 This RN made first attempt to contact pt with no answer. A vm was left with call back requested. Call back number provided.   Copied from CRM [id number]. Topic: Clinical - Medical Advice >> Oct 16, 2023  8:48 AM Florestine Avers wrote: Reason for CRM: Patient called in stating that she was prescribed a antibiotic that caused her to get a yeast infection. Patient states that her symptom is vaginal itching, she is requesting for a prescription to be call into [facility] in [facility]. She states she can not take over the counter monistat and would like something else prescribed. Patient is also requesting.

## 2023-10-16 NOTE — Telephone Encounter (Signed)
 This RN made first attempt to contact pt with no answer. A vm was left with call back requested. Call back number provided.

## 2023-10-16 NOTE — Telephone Encounter (Signed)
  This RN made first attempt to contact pt with no answer. A vm was left with call back requested. Call back number provided

## 2023-10-16 NOTE — Progress Notes (Signed)
Unable to connect to patient

## 2023-11-08 ENCOUNTER — Ambulatory Visit: Admitting: Nurse Practitioner

## 2023-11-16 ENCOUNTER — Ambulatory Visit: Admitting: Nurse Practitioner

## 2023-11-16 ENCOUNTER — Telehealth: Admitting: Nurse Practitioner

## 2023-11-16 DIAGNOSIS — I10 Essential (primary) hypertension: Secondary | ICD-10-CM | POA: Diagnosis not present

## 2023-11-16 DIAGNOSIS — E559 Vitamin D deficiency, unspecified: Secondary | ICD-10-CM

## 2023-11-16 DIAGNOSIS — E611 Iron deficiency: Secondary | ICD-10-CM

## 2023-11-16 NOTE — Progress Notes (Signed)
 Established Patient Office Visit  An audio/visual tele-health visit was completed today for this patient. I connected with  Katelyn Anthony on 11/16/23 utilizing audio/visual technology and verified that I am speaking with the correct person using two identifiers. The patient was located at their home, and I was located at the office of [facility] during the encounter. I discussed the limitations of evaluation and management by telemedicine. The patient expressed understanding and agreed to proceed.    Subjective   Patient ID: Katelyn Anthony, female    DOB: Mar 22, 1946  Age: 78 y.o. MRN: [id number]  Chief Complaint  Patient presents with   Medical Management of Chronic Issues    Follow up, can not take iron pill due to it causing her to have constipation.    Low iron without anemia: Incidental finding on last lab draw, iron level of 34.  Ferritin normal, hemoglobin normal.  She tried taking ferrous sulfate supplement but this caused abdominal discomfort.  Wants to discuss treatment options.  Vitamin D  Deficiency: Not currently on supplement, last serum level was 26.57.  Elevated BP: Has had elevated blood pressure readings intermittently in the past, denies any chest pain, shortness of breath, exertional fatigue    Review of Systems  Eyes:  Negative for blurred vision.  Respiratory:  Negative for shortness of breath.   Cardiovascular:  Negative for chest pain.  Neurological:  Negative for headaches.      Objective:     There were no vitals taken for this visit. BP Readings from Last 3 Encounters:  10/11/23 137/69  10/08/23 (!) 141/70  09/27/23 (!) 140/70   Wt Readings from Last 3 Encounters:  10/11/23 92 lb (41.7 kg)  09/27/23 92 lb 6 oz (41.9 kg)  06/27/23 92 lb 3.2 oz (41.8 kg)      Physical Exam Comprehensive physical exam not completed today as office visit was conducted remotely.  Patient appears well on video.  Patient was alert and  oriented, and appeared to have appropriate judgment.   No results found for any visits on 11/16/23.    The 10-year ASCVD risk score (Arnett DK, et al., 2019) is: 24.9%    Assessment & Plan:   Problem List Items Addressed This Visit       Cardiovascular and Mediastinum   Hypertension   Chronic, asymptomatic Patient would prefer to focus on lifestyle modification and monitor blood pressure closely as opposed to start antihypertensive at this time. Last blood pressure reading in the system 137/69, I am agreeable to monitoring closely. For now she was encouraged to focus on healthy diet, regular exercise, and follow-up as scheduled to monitor blood pressure.  She was educated on signs and symptoms of elevated blood pressure and when to call the office.        Other   Vitamin D  insufficiency - Primary   Chronic Serum level 26.57, recommend supplementation with vitamin D3 1000 IUs by mouth daily      Iron deficiency   Per shared decision making patient would like to stay off of iron supplementation as ferrous sulfate caused her abdominal discomfort.  She would like to focus on increasing dietary iron sources.  We discussed foods rich in iron today.  We also discussed accrufer, but patient would like to see if she can supplement her diet first. Consider checking iron level at next lab draw.        No follow-ups on file.    Freddie Jaegers  Martina Sledge, NP

## 2023-11-16 NOTE — Assessment & Plan Note (Signed)
 Chronic Serum level 26.57, recommend supplementation with vitamin D3 1000 IUs by mouth daily

## 2023-11-16 NOTE — Assessment & Plan Note (Signed)
 Per shared decision making patient would like to stay off of iron supplementation as ferrous sulfate caused her abdominal discomfort.  She would like to focus on increasing dietary iron sources.  We discussed foods rich in iron today.  We also discussed accrufer, but patient would like to see if she can supplement her diet first. Consider checking iron level at next lab draw.

## 2023-11-16 NOTE — Assessment & Plan Note (Signed)
 Chronic, asymptomatic Patient would prefer to focus on lifestyle modification and monitor blood pressure closely as opposed to start antihypertensive at this time. Last blood pressure reading in the system 137/69, I am agreeable to monitoring closely. For now she was encouraged to focus on healthy diet, regular exercise, and follow-up as scheduled to monitor blood pressure.  She was educated on signs and symptoms of elevated blood pressure and when to call the office.

## 2024-01-05 DIAGNOSIS — Z862 Personal history of diseases of the blood and blood-forming organs and certain disorders involving the immune mechanism: Secondary | ICD-10-CM | POA: Diagnosis not present

## 2024-01-19 ENCOUNTER — Other Ambulatory Visit (HOSPITAL_BASED_OUTPATIENT_CLINIC_OR_DEPARTMENT_OTHER): Payer: Self-pay

## 2024-01-29 ENCOUNTER — Telehealth: Payer: Self-pay

## 2024-01-29 NOTE — Telephone Encounter (Signed)
 Copied from CRM [id number]. Topic: Clinical - Medical Advice >> Jan 29, 2024 11:07 AM Macario HERO wrote: Reason for CRM: Patient calling to ask if she needs to be vaccinated for the current measles outbreak even though she was previously vaccinated as child. She said she had g[place] measles. Follow up with patient.

## 2024-01-31 DIAGNOSIS — N644 Mastodynia: Secondary | ICD-10-CM | POA: Diagnosis not present

## 2024-02-19 ENCOUNTER — Telehealth: Payer: Self-pay | Admitting: Nurse Practitioner

## 2024-02-19 NOTE — Telephone Encounter (Signed)
 Copied from CRM [id number]. Topic: Clinical - Medical Advice >> Feb 19, 2024 10:07 AM Katelyn Anthony wrote: Reason for CRM: Pt called in and would like to know what is entailed in the Free Health Check by her medicare. Keeps receiving phone call about free health check.    Pt would like to know if the following can be done at the free health check:  Pt would like ears to be cleaned and would like to know what shampoo for dry skin and dry hair. What vitamins is she looking for to combat dry skin, fingernails and hair.    Pt callback [phone]

## 2024-02-21 ENCOUNTER — Telehealth: Payer: Self-pay

## 2024-02-21 NOTE — Telephone Encounter (Signed)
 Copied from CRM [id number]. Topic: Clinical - Medical Advice >> Feb 21, 2024 12:02 PM Melissa C wrote: Reason for CRM: patient received call from Inez and I regarding previous CRM, I do not see any documentation yet however patient has additional questions: patient was calling to see if [facility] for hair, skin, and nails and Nutrafol are good and safe vitamins to take because she is post menopausal and there are so many things on the internet. Also patient got message from the CDC saying that if you are over 94 years of age you are eligible to get vaccination for RSV but when she spoke to her pharmacist he said there was a lot more to it. She was wondering if she was a candidate for the RSV vaccination and if she needed and order/prescription to receive it. Please advise with patient. Patient stated if she doesn't answer you can leave a message as no one else listens to her messages. I let her know that due to HIPAA that may not be possible

## 2024-02-22 NOTE — Telephone Encounter (Signed)
 Copied from CRM [id number]. Topic: Clinical - Medical Advice >> Feb 22, 2024  3:15 PM Katelyn Anthony wrote: Reason for CRM:  Patient advised via MyChart to schedule visit with pharmacist or provider for the following questions:   Pt called in and would like to know what is entailed in the Free Health Check by her medicare. Keeps receiving phone call about free health check.  Pt would like to know if the following can be done at the free health check: Pt would like ears to be cleaned and would like to know what shampoo for dry skin and dry hair. What vitamins is she looking for to combat dry skin, fingernails and hair. Nutraful? Does anyone know much about this? As well Weem. Do they work/are they harmful? FDA approved? Patient wants to know if labs from 3/10 measured her nitrogen oxygination of the blood and if that could affect her ability to sleep - said beet root was recommended and wants to know if that was true. She also wants to know if she needs to get the RSV vaccination even if she doesn't have any lung issues?   Katelyn Anthony not available until Sept. And patient would not like to wait that long - please have pharmacist call to schedule. She is requesting a VM be left with a direct line/extension in case she misses the call.

## 2024-02-23 ENCOUNTER — Other Ambulatory Visit (HOSPITAL_BASED_OUTPATIENT_CLINIC_OR_DEPARTMENT_OTHER): Payer: Self-pay

## 2024-02-23 ENCOUNTER — Encounter: Payer: Self-pay | Admitting: Nurse Practitioner

## 2024-02-23 ENCOUNTER — Telehealth: Payer: Self-pay

## 2024-02-23 ENCOUNTER — Other Ambulatory Visit: Payer: Self-pay | Admitting: Internal Medicine

## 2024-02-23 ENCOUNTER — Telehealth: Payer: Self-pay | Admitting: Internal Medicine

## 2024-02-23 DIAGNOSIS — E039 Hypothyroidism, unspecified: Secondary | ICD-10-CM

## 2024-02-23 DIAGNOSIS — L659 Nonscarring hair loss, unspecified: Secondary | ICD-10-CM

## 2024-02-23 NOTE — Telephone Encounter (Signed)
 Patient notified and will come in and have labs checked on 02/27/24

## 2024-02-23 NOTE — Telephone Encounter (Signed)
 Patient has been having  troubling sleeping and wants to know if this be because of her thyroid .  Would like to have labs checked.

## 2024-02-23 NOTE — Telephone Encounter (Signed)
 Patient called this morning and scheduled a lab appointment.She also wanted Dr.Shamleffer to include in her lab work to check her nitrates and iron levels if it's possible.Patient is also asking for advice on what type of moisturizing shampoo,conditioner as well as soap,stating her skin and hair are very dry and her hair is thinning.Patient would like a phone call back as she does not utilize MyChart.Her contact info is [phone]

## 2024-02-24 ENCOUNTER — Encounter: Payer: Self-pay | Admitting: Nurse Practitioner

## 2024-02-26 DIAGNOSIS — R768 Other specified abnormal immunological findings in serum: Secondary | ICD-10-CM | POA: Insufficient documentation

## 2024-02-26 DIAGNOSIS — M1990 Unspecified osteoarthritis, unspecified site: Secondary | ICD-10-CM | POA: Insufficient documentation

## 2024-02-27 ENCOUNTER — Telehealth: Payer: Self-pay

## 2024-02-27 ENCOUNTER — Other Ambulatory Visit

## 2024-02-27 NOTE — Telephone Encounter (Signed)
 Copied from CRM #[id number]. Topic: Referral - Question >> Feb 27, 2024  3:35 PM Deleta RAMAN wrote: Reason for CRM: patient would like to know if she should see a cardiologist regarding health and concerns on blood vessel. Please give her a call at the number on fine.

## 2024-02-27 NOTE — Telephone Encounter (Signed)
 LDTVM

## 2024-02-27 NOTE — Telephone Encounter (Signed)
 Copied from CRM #[id number]. Topic: General - Call Back - No Documentation >> Feb 27, 2024  3:47 PM Katelyn Anthony wrote: Reason for CRM: patient is calling due to missed call from office please give her a call at the number on file. She has other questions and concerns as well. Please call the patient on THURSDAY or leave voice message on tomorrow

## 2024-02-27 NOTE — Telephone Encounter (Signed)
 Patient states that she has already had her TSH checked 2 times this year and wants to know if she needs to recheck. Patient states that 15 minutes after she takes medication she falls asleep and wants to know if this is normal.

## 2024-02-28 NOTE — Telephone Encounter (Signed)
 Called pt and left voice mail to call

## 2024-03-11 DIAGNOSIS — N644 Mastodynia: Secondary | ICD-10-CM | POA: Diagnosis not present

## 2024-03-13 DIAGNOSIS — H2513 Age-related nuclear cataract, bilateral: Secondary | ICD-10-CM | POA: Diagnosis not present

## 2024-03-13 DIAGNOSIS — H5213 Myopia, bilateral: Secondary | ICD-10-CM | POA: Diagnosis not present

## 2024-03-18 ENCOUNTER — Encounter: Payer: Self-pay | Admitting: Obstetrics and Gynecology

## 2024-03-19 ENCOUNTER — Other Ambulatory Visit: Payer: Self-pay | Admitting: Obstetrics and Gynecology

## 2024-03-19 DIAGNOSIS — R928 Other abnormal and inconclusive findings on diagnostic imaging of breast: Secondary | ICD-10-CM

## 2024-04-04 ENCOUNTER — Encounter: Payer: Self-pay | Admitting: Internal Medicine

## 2024-04-08 ENCOUNTER — Ambulatory Visit: Admitting: Family Medicine

## 2024-04-11 ENCOUNTER — Ambulatory Visit: Admitting: Nurse Practitioner

## 2024-04-11 ENCOUNTER — Other Ambulatory Visit (HOSPITAL_BASED_OUTPATIENT_CLINIC_OR_DEPARTMENT_OTHER): Payer: Self-pay

## 2024-04-11 VITALS — BP 140/82 | HR 75 | Temp 97.9°F | Ht 61.0 in | Wt 91.8 lb

## 2024-04-11 DIAGNOSIS — R0981 Nasal congestion: Secondary | ICD-10-CM | POA: Diagnosis not present

## 2024-04-11 DIAGNOSIS — Z136 Encounter for screening for cardiovascular disorders: Secondary | ICD-10-CM | POA: Insufficient documentation

## 2024-04-11 DIAGNOSIS — G4700 Insomnia, unspecified: Secondary | ICD-10-CM

## 2024-04-11 DIAGNOSIS — I10 Essential (primary) hypertension: Secondary | ICD-10-CM | POA: Diagnosis not present

## 2024-04-11 DIAGNOSIS — H6122 Impacted cerumen, left ear: Secondary | ICD-10-CM | POA: Diagnosis not present

## 2024-04-11 DIAGNOSIS — K5900 Constipation, unspecified: Secondary | ICD-10-CM

## 2024-04-11 DIAGNOSIS — H6120 Impacted cerumen, unspecified ear: Secondary | ICD-10-CM | POA: Insufficient documentation

## 2024-04-11 MED ORDER — FLUTICASONE PROPIONATE 50 MCG/ACT NA SUSP
2.0000 | Freq: Every day | NASAL | 1 refills | Status: DC
  Filled 2024-04-11: qty 16, 30d supply, fill #0

## 2024-04-11 NOTE — Assessment & Plan Note (Signed)
 Nasal congestion with dry mucus Dry mucus and congestion, especially postprandial. - Use Flonase  nasal spray, one to two sprays in each nostril once a day

## 2024-04-11 NOTE — Progress Notes (Signed)
 Established Patient Office Visit  Subjective   Patient ID: Katelyn Anthony, female    DOB: [birth year]  Age: 78 y.o. MRN: [id number]  Chief Complaint  Patient presents with   Immunizations    Patient here wanting covid script. Patient is wanting both ears checked. Also had some questions about supplements and calcium score screening. Wanting to discuss Blood cancer screening     Discussed the use of AI scribe software for clinical note transcription with the patient, who gave verbal consent to proceed.  History of Present Illness Katelyn Anthony is a 78 year old female who presents with concerns about constipation and sleep disturbances.  Constipation - Constipation managed with Senokot every other day - Inquires about the safety of using Lymepak, a supplement containing psyllium husk, quercetin, garlic, Senna, licorice, and wormwood - History of colon resection and diverticulitis  Sleep disturbance - Difficulty falling asleep - Wakes up after two hours of sleep - Considering melatonin extended release for sleep disturbances - Concerned about potential side effects of melatonin, such as dry mouth  Nasal Congestion - Asking what she can take other than saline nasal spray for her nasal congestion  Hypertension - Not currently using pharmacological treatment -Reports that she has a stressful job which she thinks contributes to her high blood pressure - Currently asymptomatic  ASCVD Risk - Would like to discuss ASCVD risk as well as further testing and treatment options  Cerumen Impaction - Would like me to evaluate ears for cerumen impaction today       Review of Systems  Cardiovascular:  Negative for chest pain and palpitations.  Gastrointestinal:  Positive for constipation.      Objective:     BP (!) 140/82 (BP Location: Left Arm, Patient Position: Sitting, Cuff Size: Normal)   Pulse 75   Temp 97.9 F (36.6 C) (Oral)   Ht 5' 1 (1.549 m)   Wt 91 lb 12.8 oz  (41.6 kg)   SpO2 98%   BMI 17.35 kg/m  BP Readings from Last 3 Encounters:  04/11/24 (!) 140/82  10/11/23 137/69  10/08/23 (!) 141/70   Wt Readings from Last 3 Encounters:  04/11/24 91 lb 12.8 oz (41.6 kg)  10/11/23 92 lb (41.7 kg)  09/27/23 92 lb 6 oz (41.9 kg)      Physical Exam Vitals reviewed.  Constitutional:      General: She is not in acute distress.    Appearance: Normal appearance.  HENT:     Head: Normocephalic and atraumatic.  Neck:     Vascular: No carotid bruit.  Cardiovascular:     Rate and Rhythm: Normal rate and regular rhythm.     Pulses: Normal pulses.     Heart sounds: Normal heart sounds.  Pulmonary:     Effort: Pulmonary effort is normal.     Breath sounds: Normal breath sounds.  Skin:    General: Skin is warm and dry.  Neurological:     General: No focal deficit present.     Mental Status: She is alert and oriented to person, place, and time.  Psychiatric:        Mood and Affect: Mood normal.        Behavior: Behavior normal.        Judgment: Judgment normal.      No results found for any visits on 04/11/24.     The 10-year ASCVD risk score (Arnett DK, et al., 2019) is: 25.8%    Assessment & Plan:  Problem List Items Addressed This Visit       Cardiovascular and Mediastinum   Hypertension   Hypertension Blood pressure is 140/82, borderline. Emphasized importance of home monitoring  - Obtain an automatic arm blood pressure cuff for home monitoring. - Check blood pressure after being seated and quiet for five minutes, aiming for a top number under 140 and a bottom number under 80. - Monitor blood pressure at home for the next few weeks to determine if medication is needed.        Nervous and Auditory   Cerumen impaction   Cerumen impaction, left ear Significant wax buildup in the left ear, obstructing the view of the eardrum. - Perform ear cleaning to remove cerumen from the left ear.        Other   Constipation    Constipation in the setting of diverticulosis and history of colon resection Chronic constipation with diverticulosis and prior colon resection. Currently using Senokot every other day. - Consider trial of Elimipure supplement as requested by patient. Per label it states to take 3 tablets/day advised to start with 1-2 tablets/day with gradual increase to 3. Monitor closely for side effects.      Insomnia   Sleep disturbance Difficulty with sleep, including waking after two hours or inability to fall asleep. - Try melatonin extended release, 1-5 mg as needed, starting with a lower dose and increasing as needed.      Nasal congestion - Primary   Nasal congestion with dry mucus Dry mucus and congestion, especially postprandial. - Use Flonase  nasal spray, one to two sprays in each nostril once a day       Relevant Medications   fluticasone  (FLONASE ) 50 MCG/ACT nasal spray   Screening for cardiovascular condition    Atherosclerotic cardiovascular disease risk assessment High risk based on ASCVD risk score (~25%). Discussed potential use of calcium CT scan to assess plaque buildup and guide cholesterol medication decisions. Discussed benefits and risks of statins -Patient to let me know if she would like to move forward with calcium ct score      Assessment and Plan Assessment & Plan Constipation in the setting of diverticulosis and history of colon resection Chronic constipation with diverticulosis and prior colon resection. Currently using Senokot every other day. - Consider trial of Elimipure supplement as requested by patient. Per label it states to take 3 tablets/day advised to start with 1-2 tablets/day with gradual increase to 3. Monitor closely for side effects.  Sleep disturbance Difficulty with sleep, including waking after two hours or inability to fall asleep. - Try melatonin extended release, 1-5 mg as needed, starting with a lower dose and increasing as  needed.  Hypertension Blood pressure is 140/82, borderline. Emphasized importance of home monitoring  - Obtain an automatic arm blood pressure cuff for home monitoring. - Check blood pressure after being seated and quiet for five minutes, aiming for a top number under 140 and a bottom number under 80. - Monitor blood pressure at home for the next few weeks to determine if medication is needed.  Atherosclerotic cardiovascular disease risk assessment High risk based on ASCVD risk score (~25%). Discussed potential use of calcium CT scan to assess plaque buildup and guide cholesterol medication decisions. Discussed benefits and risks of statins -Patient to let me know if she would like to move forward with calcium ct score  Cerumen impaction, left ear Significant wax buildup in the left ear, obstructing the view of the eardrum. - Perform ear  cleaning to remove cerumen from the left ear.  Nasal congestion with dry mucus Dry mucus and congestion, especially postprandial. - Use Flonase  nasal spray, one to two sprays in each nostril once a day    Return in about 3 months (around 07/11/2024) for F/U with Lauraine.   I personally spent a total of 38 minutes in the care of the patient today including preparing to see the patient, getting/reviewing separately obtained history, performing a medically appropriate exam/evaluation, counseling and educating, placing orders, and documenting clinical information in the EHR.   Lauraine FORBES Pereyra, NP

## 2024-04-11 NOTE — Assessment & Plan Note (Signed)
 Cerumen impaction, left ear Significant wax buildup in the left ear, obstructing the view of the eardrum. - Perform ear cleaning to remove cerumen from the left ear.

## 2024-04-11 NOTE — Assessment & Plan Note (Signed)
 Hypertension Blood pressure is 140/82, borderline. Emphasized importance of home monitoring  - Obtain an automatic arm blood pressure cuff for home monitoring. - Check blood pressure after being seated and quiet for five minutes, aiming for a top number under 140 and a bottom number under 80. - Monitor blood pressure at home for the next few weeks to determine if medication is needed.

## 2024-04-11 NOTE — Assessment & Plan Note (Signed)
 Sleep disturbance Difficulty with sleep, including waking after two hours or inability to fall asleep. - Try melatonin extended release, 1-5 mg as needed, starting with a lower dose and increasing as needed.

## 2024-04-11 NOTE — Assessment & Plan Note (Signed)
 Constipation in the setting of diverticulosis and history of colon resection Chronic constipation with diverticulosis and prior colon resection. Currently using Senokot every other day. - Consider trial of Elimipure supplement as requested by patient. Per label it states to take 3 tablets/day advised to start with 1-2 tablets/day with gradual increase to 3. Monitor closely for side effects.

## 2024-04-11 NOTE — Patient Instructions (Addendum)
 Melatonin 1-5mg  - extended release  Goal blood pressure: <140/<80

## 2024-04-11 NOTE — Assessment & Plan Note (Signed)
  Atherosclerotic cardiovascular disease risk assessment High risk based on ASCVD risk score (~25%). Discussed potential use of calcium CT scan to assess plaque buildup and guide cholesterol medication decisions. Discussed benefits and risks of statins -Patient to let me know if she would like to move forward with calcium ct score

## 2024-04-12 ENCOUNTER — Other Ambulatory Visit (HOSPITAL_COMMUNITY): Payer: Self-pay

## 2024-04-15 ENCOUNTER — Other Ambulatory Visit (HOSPITAL_BASED_OUTPATIENT_CLINIC_OR_DEPARTMENT_OTHER): Payer: Self-pay

## 2024-04-15 ENCOUNTER — Other Ambulatory Visit (HOSPITAL_COMMUNITY): Payer: Self-pay

## 2024-05-15 ENCOUNTER — Other Ambulatory Visit (HOSPITAL_BASED_OUTPATIENT_CLINIC_OR_DEPARTMENT_OTHER): Payer: Self-pay

## 2024-05-15 DIAGNOSIS — M7742 Metatarsalgia, left foot: Secondary | ICD-10-CM | POA: Diagnosis not present

## 2024-05-15 DIAGNOSIS — M216X2 Other acquired deformities of left foot: Secondary | ICD-10-CM | POA: Diagnosis not present

## 2024-05-15 MED ORDER — COMIRNATY 30 MCG/0.3ML IM SUSY
0.3000 mL | PREFILLED_SYRINGE | Freq: Once | INTRAMUSCULAR | 0 refills | Status: AC
  Filled 2024-05-15: qty 0.3, 1d supply, fill #0

## 2024-05-20 ENCOUNTER — Other Ambulatory Visit (HOSPITAL_BASED_OUTPATIENT_CLINIC_OR_DEPARTMENT_OTHER): Payer: Self-pay

## 2024-05-20 ENCOUNTER — Ambulatory Visit: Payer: Self-pay

## 2024-05-20 NOTE — Telephone Encounter (Signed)
 FYI Only or Action Required?: FYI only for provider.  Patient was last seen in primary care on 04/11/2024 by Elnor Lauraine BRAVO, NP.  Called Nurse Triage reporting Covid Vaccine possible reaction.  Symptoms began several days ago.  Interventions attempted: OTC medications: Advil and Rest, hydration, or home remedies.  Symptoms are: unchanged.  Triage Disposition: See PCP When Office is Open (Within 3 Days)  Patient/caregiver understands and will follow disposition?: Yes     Copied from CRM [id number]. Topic: Clinical - Red Word Triage >> May 20, 2024  5:07 PM Rozanna MATSU wrote: Red Word that prompted transfer to Nurse Triage: pt stated stiffness upper body, losing her voice all of this after she received the Covid vaccine last Wednesday. Stated not feeling well at all      Reason for Disposition  [1] Pain, tenderness, or swelling at the injection site AND [2] lasts > 7 days  Answer Assessment - Initial Assessment Questions 1. MAIN CONCERN OR SYMPTOM: What is your main concern? What is the main symptom? (e.g., fever, pain, redness, swelling)     Stiffness to her upper body 2. ONSET: When was the vaccine (shot) given? How much later did the symptoms begin? (e.g., hours, days ago)      Vaccine 10/22, symptoms started 10/24 3. SEVERITY: How bad is it?      Mild to moderate  4. VACCINE: What vaccination did you receive? (e.g., none; Moderna, [employer])       [employer]   5. FEVER: Do you have a fever? If Yes, ask: What is your temperature, how was it measured, and when did it start?      No 6. PAST REACTIONS: Have you reacted to immunizations before? If Yes, ask: What happened?     No 7. OTHER SYMPTOMS: Do you have any other symptoms? (e.g., fatigue, headache, joint or muscle pain)     Difficulty sleeping, I feel dullness of affect, like I don't feel like myself  Protocols used: COVID-19 - Vaccine Questions and Reactions-A-AH

## 2024-05-21 ENCOUNTER — Other Ambulatory Visit: Payer: Self-pay | Admitting: Obstetrics and Gynecology

## 2024-05-21 ENCOUNTER — Ambulatory Visit: Admitting: Family Medicine

## 2024-05-21 DIAGNOSIS — R921 Mammographic calcification found on diagnostic imaging of breast: Secondary | ICD-10-CM

## 2024-05-21 NOTE — Progress Notes (Deleted)
   Acute Office Visit  Subjective:     Patient ID: Katelyn Anthony, female    DOB: 1946/05/16, 78 y.o.   MRN: [id number]  No chief complaint on file.   HPI  Discussed the use of AI scribe software for clinical note transcription with the patient, who gave verbal consent to proceed.  History of Present Illness      ROS Per HPI      Objective:    There were no vitals taken for this visit.   Physical Exam Vitals and nursing note reviewed.  Constitutional:      General: She is not in acute distress.    Appearance: Normal appearance. She is normal weight.  HENT:     Head: Normocephalic and atraumatic.     Right Ear: External ear normal.     Left Ear: External ear normal.     Nose: Nose normal.     Mouth/Throat:     Mouth: Mucous membranes are moist.     Pharynx: Oropharynx is clear.  Eyes:     Extraocular Movements: Extraocular movements intact.     Pupils: Pupils are equal, round, and reactive to light.  Cardiovascular:     Rate and Rhythm: Normal rate and regular rhythm.     Pulses: Normal pulses.     Heart sounds: Normal heart sounds.  Pulmonary:     Effort: Pulmonary effort is normal. No respiratory distress.     Breath sounds: Normal breath sounds. No wheezing, rhonchi or rales.  Musculoskeletal:        General: Normal range of motion.     Cervical back: Normal range of motion.     Right lower leg: No edema.     Left lower leg: No edema.  Lymphadenopathy:     Cervical: No cervical adenopathy.  Neurological:     General: No focal deficit present.     Mental Status: She is alert and oriented to person, place, and time.  Psychiatric:        Mood and Affect: Mood normal.        Thought Content: Thought content normal.     No results found for any visits on 05/21/24.      Assessment & Plan:   Assessment and Plan Assessment & Plan      No orders of the defined types were placed in this encounter.    No orders of the defined types were placed  in this encounter.   No follow-ups on file.  Corean LITTIE Ku, FNP

## 2024-05-22 ENCOUNTER — Ambulatory Visit: Payer: Self-pay | Admitting: *Deleted

## 2024-05-22 NOTE — Telephone Encounter (Signed)
 FYI Only or Action Required?: FYI only for provider: appointment scheduled on 10/30.  Patient was last seen in primary care on 04/11/2024 by Elnor Lauraine BRAVO, NP.  Called Nurse Triage reporting Back Pain (Not sleeping, decreased appetite).  Symptoms began a week ago.  Interventions attempted: OTC medications: Aleve PM.  Symptoms are: unchanged.  Triage Disposition: See PCP When Office is Open (Within 3 Days)  Patient/caregiver understands and will follow disposition?: yes   Reason for Disposition  [1] MODERATE pain (e.g., interferes with normal activities) AND [2] present > 3 days  Answer Assessment - Initial Assessment Questions Patient has multiple complaints- symptoms starting after her COVID injection over 1 week ago.   1. ONSET: When did the muscle aches or body pains start?      COVID vaccine last Wednesday- patient reports she has been ill since the vaccine 2. LOCATION: What part of your body is hurting? (e.g., entire body, arms, legs)      Back pain,foggy mind, sore- body pain  3. SEVERITY: How bad is the pain? (Scale 1-10; or mild, moderate, severe)     Unable to bend over due to soreness- hip 4. CAUSE: What do you think is causing the pains?     Patient states she feels like when she had pneumonia  5. FEVER: Do you have a fever? If Yes, ask: What is your temperature, how was it measured, and  when did it start?      Unsure- not checked 6. OTHER SYMPTOMS: Do you have any other symptoms? (e.g., chest pain, cold or flu symptoms, rash, weakness, weight loss)     Sore body  Protocols used: Muscle Aches and Body Pain-A-AH  Copied from CRM #[id number]. Topic: Clinical - Red Word Triage >> May 22, 2024  3:03 PM China J wrote: [facility] that prompted transfer to Nurse Triage: The patient has been feeling very bad ever since she had the COVID vaccination. Symptoms: Back pain and stiffness, brain fog, fatigue, aching.

## 2024-05-23 ENCOUNTER — Ambulatory Visit (INDEPENDENT_AMBULATORY_CARE_PROVIDER_SITE_OTHER): Admitting: Internal Medicine

## 2024-05-23 ENCOUNTER — Other Ambulatory Visit (HOSPITAL_BASED_OUTPATIENT_CLINIC_OR_DEPARTMENT_OTHER): Payer: Self-pay

## 2024-05-23 ENCOUNTER — Encounter: Payer: Self-pay | Admitting: Internal Medicine

## 2024-05-23 VITALS — BP 132/78 | HR 70 | Temp 98.0°F | Ht 61.0 in | Wt 92.0 lb

## 2024-05-23 DIAGNOSIS — R030 Elevated blood-pressure reading, without diagnosis of hypertension: Secondary | ICD-10-CM | POA: Diagnosis not present

## 2024-05-23 DIAGNOSIS — B351 Tinea unguium: Secondary | ICD-10-CM

## 2024-05-23 DIAGNOSIS — S76012A Strain of muscle, fascia and tendon of left hip, initial encounter: Secondary | ICD-10-CM

## 2024-05-23 MED ORDER — CICLOPIROX 0.77 % EX GEL
1.0000 | Freq: Two times a day (BID) | CUTANEOUS | 0 refills | Status: DC
  Filled 2024-05-23 – 2024-06-06 (×2): qty 45, 30d supply, fill #0

## 2024-05-23 NOTE — Patient Instructions (Addendum)
     Continue aleve and heat for the muscle pain.     Medications changes include :   ciclopirox gel twice daily x 4 weeks     Return if symptoms worsen or fail to improve.

## 2024-05-23 NOTE — Progress Notes (Signed)
 Subjective:    Patient ID: Katelyn Anthony, female    DOB: [birth year], 78 y.o.   MRN: [id number]      HPI Katelyn Anthony is here for  Chief Complaint  Patient presents with   Sore glute muscle    Sore since she got her COVID shot; Wants to make sure she doesn't have pneumonia   Discussed the use of AI scribe software for clinical note transcription with the patient, who gave verbal consent to proceed.  History of Present Illness Katelyn Anthony is a 78 year old female who presents with post-vaccination symptoms and concerns about pneumonia.  Eight days ago, she received an updated COVID vaccination. Within 24 hours, she developed upper body stiffness, soreness, a foggy mind, and insomnia. These symptoms persisted for seven days, with today being the first day she feels somewhat better. She has had difficulty sleeping, stating she has not been able to sleep for seven days, describing the experience as 'awful'.  She experiences soreness in the upper gluteus maximus, making it difficult to put on socks or lay down to sleep. She questions if this is related to the vaccination, noting she has never had a reaction to a vaccination before. She has been living uncomfortably for about seven days and is concerned about the possibility of pneumonia, as she had similar symptoms when she had pneumonia ten years ago.  She also reports a fungal infection under her nails, with one or two fingers affected, and inquires about treatment options.  No fever, sore throat, nasal congestion, sinus pain, ear pain, coughing, wheezing, shortness of breath, or lightheadedness. She reports a dull headache and a sensation of wax in the right ear, though previous checks showed no wax. She experiences a dry nose that gets stuffed up when eating hot or cold foods. She feels somewhat dehydrated, drinking a lot of juice, tea, coffee, and Coca Cola, but not much water.  She reports back pain that started after lifting heavy items at  work, which she believes may have been exacerbated by the COVID vaccination. She has tried hot bubble baths and Aleve, including Aleve PM, with limited relief. The pain is described as inflammation of the tissues around the bone, particularly in the buttocks, and feels like 'someone ran over me and then backed up'.  She normally walks two to three miles without stress but has found it increasingly difficult to walk a mile recently. She is on vacation from work this week and is concerned about needing a doctor's note if she continues to feel unwell.  She expresses concern about her blood pressure and wonders if it could be related to her feeling lethargic and tired. She is particularly worried about the possibility of pneumonia, given her symptoms of lethargy, lack of appetite, and inability to sleep over the past week.      Medications and allergies reviewed with patient and updated if appropriate.  Current Outpatient Medications on File Prior to Visit  Medication Sig Dispense Refill   amoxicillin  (AMOXIL ) 500 MG capsule Take 4 capsules (2,000 mg total) by mouth once for 1 dose 1 hr prior to dental apt. 16 capsule 2   Cholecalciferol (VITAMIN D -3) 25 MCG (1000 UT) CAPS Take by mouth.     docusate sodium  (COLACE) 100 MG capsule Take 1 capsule (100 mg total) by mouth every 12 (twelve) hours. 60 capsule 0   estradiol  (VIVELLE -DOT) 0.1 MG/24HR patch Place 1 patch onto the skin once a week.  fluticasone  (FLONASE ) 50 MCG/ACT nasal spray Place 2 sprays into both nostrils daily. 16 g 1   Multiple Vitamin (MULTIVITAMIN WITH MINERALS) TABS tablet Take 1 tablet by mouth daily.     NONFORMULARY OR COMPOUNDED ITEM Elimipure     senna (SENOKOT) 8.6 MG TABS tablet Take 1 tablet by mouth.     SYNTHROID  75 MCG tablet Take 1 tablet (75 mcg total) by mouth as directed. Half a tablet on Sundays, 1 tablet rest of the week 85 tablet 3   No current facility-administered medications on file prior to visit.     Review of Systems  Constitutional:  Negative for chills and fever.  HENT:  Negative for congestion, ear pain, sinus pressure and sore throat.   Respiratory:  Negative for cough, shortness of breath and wheezing.   Cardiovascular:  Negative for chest pain and palpitations.  Musculoskeletal:  Positive for back pain (lower back / upper buttock region).  Neurological:  Positive for headaches (dull). Negative for light-headedness.       Objective:   Vitals:   05/23/24 1025  BP: (!) 144/70  Pulse: 70  Temp: 98 F (36.7 C)  SpO2: 94%   BP Readings from Last 3 Encounters:  05/23/24 (!) 144/70  04/11/24 (!) 140/82  10/11/23 137/69   Wt Readings from Last 3 Encounters:  05/23/24 92 lb (41.7 kg)  04/11/24 91 lb 12.8 oz (41.6 kg)  10/11/23 92 lb (41.7 kg)   Body mass index is 17.38 kg/m.    Physical Exam Constitutional:      General: She is not in acute distress.    Appearance: Normal appearance. She is not ill-appearing.  HENT:     Head: Normocephalic and atraumatic.     Right Ear: Tympanic membrane, ear canal and external ear normal.     Left Ear: Tympanic membrane, ear canal and external ear normal.     Mouth/Throat:     Mouth: Mucous membranes are moist.     Pharynx: No oropharyngeal exudate or posterior oropharyngeal erythema.  Eyes:     Conjunctiva/sclera: Conjunctivae normal.  Cardiovascular:     Rate and Rhythm: Normal rate and regular rhythm.  Pulmonary:     Effort: Pulmonary effort is normal. No respiratory distress.     Breath sounds: Normal breath sounds. No wheezing or rales.  Musculoskeletal:        General: Tenderness (minimal tenderness upper left buttock) present.     Cervical back: Neck supple. No tenderness.  Lymphadenopathy:     Cervical: No cervical adenopathy.  Skin:    General: Skin is warm and dry.     Comments: Fingernails with polish - unable to visualize  Neurological:     Mental Status: She is alert.            Assessment &  Plan:    Assessment and Plan Assessment & Plan Gluteal muscle inflammation - strain Soreness in the left upper gluteus maximus, likely due to muscle overuse  - Continue Aleve as anti-inflammatory per bottle instructions. - Apply heat to affected area. - Rest and avoid overexertion.  Onychomycosis of fingernails Fungal infection with green discoloration under fingernails.  - Prescribe topical antifungal medication twice daily for 4 weeks. - depending on response can discuss other options with PCP - discuss low likelihood this would be successful  Elevated blood pressure Elevated blood pressure noted, improved when repeat - monitor blood pressure - no need for medication at this time.

## 2024-05-24 ENCOUNTER — Encounter: Payer: Self-pay | Admitting: Obstetrics and Gynecology

## 2024-05-24 ENCOUNTER — Other Ambulatory Visit (HOSPITAL_BASED_OUTPATIENT_CLINIC_OR_DEPARTMENT_OTHER): Payer: Self-pay

## 2024-06-03 ENCOUNTER — Other Ambulatory Visit (HOSPITAL_BASED_OUTPATIENT_CLINIC_OR_DEPARTMENT_OTHER): Payer: Self-pay

## 2024-06-06 ENCOUNTER — Other Ambulatory Visit (HOSPITAL_BASED_OUTPATIENT_CLINIC_OR_DEPARTMENT_OTHER): Payer: Self-pay

## 2024-06-07 ENCOUNTER — Other Ambulatory Visit: Payer: Self-pay | Admitting: Internal Medicine

## 2024-06-07 ENCOUNTER — Other Ambulatory Visit (HOSPITAL_BASED_OUTPATIENT_CLINIC_OR_DEPARTMENT_OTHER): Payer: Self-pay

## 2024-06-10 ENCOUNTER — Other Ambulatory Visit (HOSPITAL_BASED_OUTPATIENT_CLINIC_OR_DEPARTMENT_OTHER): Payer: Self-pay

## 2024-06-10 MED ORDER — CICLOPIROX 0.77 % EX GEL
1.0000 | Freq: Two times a day (BID) | CUTANEOUS | 0 refills | Status: DC
  Filled 2024-06-10: qty 45, 30d supply, fill #0

## 2024-06-11 ENCOUNTER — Other Ambulatory Visit (HOSPITAL_BASED_OUTPATIENT_CLINIC_OR_DEPARTMENT_OTHER): Payer: Self-pay

## 2024-06-26 ENCOUNTER — Ambulatory Visit: Payer: Self-pay

## 2024-06-26 NOTE — Telephone Encounter (Signed)
 FYI Only or Action Required?: Action required by provider: clinical question for provider.    Patient inquires if okay to take NyQuil and Flonase  for symptoms in addition to other treatment recommendations provided.   Patient was last seen in primary care on 05/23/2024 by Geofm Glade PARAS, MD.  Called Nurse Triage reporting Sinusitis.  Symptoms began several days ago.  Interventions attempted: OTC medications: Aleve PM.  Symptoms are: stable.  Triage Disposition: Home Care  Patient/caregiver understands and will follow disposition?: Yes     Copied from CRM [id number]. Topic: Clinical - Red Word Triage >> Jun 26, 2024  2:39 PM Katelyn Anthony wrote: Red Word that prompted transfer to Nurse Triage: Patient is requesting an antibiotic for a possible sinus infection. Running nose,head congestion, lack of appetite, headaches. Reason for Disposition  [1] Sinus congestion as part of a cold AND [2] present < 10 days  Answer Assessment - Initial Assessment Questions 1. LOCATION: Where does it hurt?      Reports sinus congestion and dull headache, unable to sleep without taking Aleve PM; reports hx of narrowed arteries and current sinus pressure, but denies any significant headache.  Just feels dull/heavy.   2. ONSET: When did the sinus pain start?  (e.g., hours, days)      Started feeling off with s/s on Monday and thought might be getting sick.   3. SEVERITY: How bad is the pain?   (Scale 0-10; or none, mild, moderate or severe)     Mild, dull headache and sinus pressure.   4. RECURRENT SYMPTOM: Have you ever had sinus problems before? If Yes, ask: When was the last time? and What happened that time?      Seldom gets ill, last sick 25 to 30 years ago.   5. NASAL CONGESTION: Is the nose blocked? If Yes, ask: Can you open it or must you breathe through your mouth?     Reports very dry and congested; occasional runny nose- clear drainage  6. NASAL DISCHARGE: Do you have discharge  from your nose? If so ask, What color?     Clear nasal drainage, occasional bloody nose (last was yesterday, not today). Otherwise stuffy and very dry nose. No phlegm or green mucus.   7. FEVER: Do you have a fever? If Yes, ask: What is it, how was it measured, and when did it start?      Uncertain, had chills yesterday and today but also reports common for her during this time of year.  Takes thyroid  medication for underactive thyroid .  Does not have thermometer to check temperature but plans to buy one.   8. OTHER SYMPTOMS: Do you have any other symptoms? (e.g., sore throat, cough, earache, difficulty breathing)     Constant sneezing yesterday, not as much today, but head still feels heavy and stuffy nose today remains. Reports  I think this is a sinus infection or bad head cold, don't think it's the flu... might be an allergy with the weather change and dry air.   Denies sore throat, just hoarse.  Clears throat throughout call, no difficulty speaking or breathing.   Denies vision changes or dizziness. Hx of cataracts not really affecting anything.   Poor appetite relates to sinuses or head cold.    Denies cough.  Having some postnasal drip. Wax in ears, stuffy especially R) ear, inner ear sensitive-chronic.    Recently groggy relates to taking Aleve PM.   Patient reports chronic atypical sleep pattern and sleeps approximately 4  to 5 hours per night.   Wears a mask when working in public. Not feeling like usual energetic self.    Reports a history of constipation and doesn't want to take any OTC medications that would dry system out. Chronically dry mouth and relates to permanent bridge placed a year ago.   Up to date on COVID vaccine, has not yet received Flu vaccine this year.   Has not yet used any saline treatments. Has clear saline nasal drops and fluticasone  available at home.  Patient does not have a humidistat or humidifier in her home, uncertain what the humidity  level is.  Has an older gas furnace.  Denies any visible mold growth in home and had ductwork cleaned out two years ago. Several plants in home.  Protocols used: Sinus Pain or Congestion-A-AH

## 2024-06-27 ENCOUNTER — Ambulatory Visit
Admission: EM | Admit: 2024-06-27 | Discharge: 2024-06-27 | Disposition: A | Discharge status: Home / Self Care | Attending: Emergency Medicine | Admitting: Emergency Medicine

## 2024-06-27 ENCOUNTER — Other Ambulatory Visit (HOSPITAL_BASED_OUTPATIENT_CLINIC_OR_DEPARTMENT_OTHER): Payer: Self-pay

## 2024-06-27 ENCOUNTER — Other Ambulatory Visit: Payer: Self-pay

## 2024-06-27 DIAGNOSIS — R067 Sneezing: Secondary | ICD-10-CM

## 2024-06-27 DIAGNOSIS — R0981 Nasal congestion: Secondary | ICD-10-CM

## 2024-06-27 LAB — GLUCOSE, POCT (MANUAL RESULT ENTRY): POC Glucose: 79 mg/dL (ref 70–99)

## 2024-06-27 LAB — POC COVID19/FLU A&B COMBO
Covid Antigen, POC: NEGATIVE
Influenza A Antigen, POC: NEGATIVE
Influenza B Antigen, POC: NEGATIVE

## 2024-06-27 MED ORDER — DEXAMETHASONE SOD PHOSPHATE PF 10 MG/ML IJ SOLN
10.0000 mg | Freq: Once | INTRAMUSCULAR | Status: AC
  Administered 2024-06-27: 10 mg via INTRAMUSCULAR

## 2024-06-27 MED ORDER — FLUTICASONE PROPIONATE 50 MCG/ACT NA SUSP
1.0000 | Freq: Every day | NASAL | 0 refills | Status: AC
  Filled 2024-06-27: qty 16, 30d supply, fill #0

## 2024-06-27 NOTE — Discharge Instructions (Addendum)
 COVID and flu testing were negative, you most likely have a different viral illness.  You can take the Flonase  daily to help with sneezing and allergy symptoms such as congestion.  We have given you a steroid shot which should help with the inflammation and sinus pressure.  For any pain you can take 500 mg of Tylenol  every 6-8 hours as needed.  Sleeping with a humidifier or being in a steamy bathroom can help loosen secretions in addition to drinking at least 64 ounces of water daily.  Symptoms should clear up over the weekend.  If no improvement or any changes follow-up with your primary care provider or return to clinic for reevaluation.

## 2024-06-27 NOTE — ED Triage Notes (Signed)
 Pt presents with c/o headaches, nasal congestion, sneezing, chills, hoarseness, and dry skin. Symptoms have been present for approximately 3-4 days. Has been taking Aleve PM at home with no improvement. No fevers. Currently rates overall pain/discomfort a 5/10. Works with public. Unsure of sick contacts.

## 2024-06-27 NOTE — ED Provider Notes (Signed)
 [facility]    CSN: [id number] Arrival date & time: 06/27/24  1556      History   Chief Complaint Chief Complaint  Patient presents with   Headache   Nasal Congestion    HPI Katelyn Anthony is a 78 y.o. female.   Patient presents to clinic over concern of headache, sinus pressure, nasal congestion, sneezing, chills, hoarseness and feeling dry.  Did have a nosebleed recently.  Symptoms have been persistent for the past 3 to 4 days.  Has been taking Aleve without any improvement.  She does work with the public.  Usually will rebound from illnesses in about 2 days or so, came into clinic today because the symptoms remain.  Has not had fever.  Denies cough.  Denies wheezing or shortness of breath.  Reports sneezing around 5 times in a row.  Denies sore throat.  The history is provided by the patient and medical records.  Headache   Past Medical History:  Diagnosis Date   Allergy 1954   feathers   Cataract    Cholelithiasis with cholecystitis    Chronic   Hypothyroidism    MVP (mitral valve prolapse)    Osteopenia    Pneumonia    Thyroid  disease 1995    Patient Active Problem List   Diagnosis Date Noted   Nasal congestion 04/11/2024   Cerumen impaction 04/11/2024   Screening for cardiovascular condition 04/11/2024   Idiopathic osteoarthritis 02/26/2024   Elevated antinuclear antibody (ANA) level 02/26/2024   Iron deficiency 11/16/2023   Dry skin 09/27/2023   Hypertension 09/27/2023   Need for pneumococcal vaccination 09/27/2023   Fatigue 09/27/2023   Hair loss 06/28/2023   Nerve damage of left foot 10/19/2022   Vitamin D  insufficiency 06/24/2022   Nasal dryness 04/05/2022   Plantar callus 03/15/2022   Neuropathy of left foot 06/29/2021   Plantar fat pad atrophy of left foot 02/01/2021   Arthritis 10/08/2020   Chronic fatigue syndrome 10/08/2020   Constipation 10/08/2020   Family history of colonic polyps 10/08/2020   Hyperlipidemia 10/08/2020    Hypomagnesemia 10/08/2020   Menopause 10/08/2020   Mitral valve prolapse 10/08/2020   Osteoporosis 10/08/2020   Insomnia 10/08/2020   Vitamin D  deficiency 10/08/2020   Metatarsalgia of left foot 08/25/2020   Acquired plantar porokeratosis 03/13/2020   Prominent metatarsal head of right foot 03/13/2020   Cholelithiasis and cholecystitis without obstruction 01/10/2018   Myopia with astigmatism and presbyopia, bilateral 05/10/2017   Corn of toe 12/07/2016   Multinodular goiter 08/01/2013   History of colonic polyps 01/08/2010   PNEUMONIA 05/01/2009   DYSPNEA 05/01/2009   Nonspecific (abnormal) findings on radiological and other examination of body structure 05/01/2009   ABNORMAL CHEST XRAY 05/01/2009   Hypothyroidism 03/26/2009    Past Surgical History:  Procedure Laterality Date   ABDOMINAL HYSTERECTOMY  1992   APPENDECTOMY  1992   BREAST BIOPSY Left 05/17/2017   CHOLECYSTECTOMY N/A 01/10/2018   Procedure: LAPAROSCOPIC CHOLECYSTECTOMY WITH INTRAOPERATIVE CHOLANGIOGRAM;  Surgeon: Mikell Katz, MD;  Location: [facility] OR;  Service: General;  Laterality: N/A;   COLON SURGERY  1992   diverticulitis   COLONOSCOPY WITH PROPOFOL  N/A 03/09/2016   Procedure: COLONOSCOPY WITH PROPOFOL ;  Surgeon: Elsie Cree, MD;  Location: [facility] ENDOSCOPY;  Service: Endoscopy;  Laterality: N/A;   TONSILLECTOMY AND ADENOIDECTOMY      OB History   No obstetric history on file.      Home Medications    Prior to Admission medications  Medication Sig Start Date End Date Taking? Authorizing Provider  fluticasone  (FLONASE ) 50 MCG/ACT nasal spray Place 1 spray into both nostrils daily. 06/27/24  Yes Ashford Clouse  N, FNP  Cholecalciferol (VITAMIN D -3) 25 MCG (1000 UT) CAPS Take by mouth.    [provider]  Ciclopirox  0.77 % gel Apply 1 Application topically 2 (two) times daily. 06/10/24   Geofm Glade PARAS, MD  docusate sodium  (COLACE) 100 MG capsule Take 1 capsule (100 mg total) by mouth  every 12 (twelve) hours. 10/08/23   Dreama, Haik Mahoney  N, FNP  estradiol  (VIVELLE -DOT) 0.1 MG/24HR patch Place 1 patch onto the skin once a week. 05/18/20   [provider]  Multiple Vitamin (MULTIVITAMIN WITH MINERALS) TABS tablet Take 1 tablet by mouth daily.    [provider]  NONFORMULARY OR COMPOUNDED ITEM Elimipure    [provider]  senna (SENOKOT) 8.6 MG TABS tablet Take 1 tablet by mouth.    [provider]  SYNTHROID  75 MCG tablet Take 1 tablet (75 mcg total) by mouth as directed. Half a tablet on Sundays, 1 tablet rest of the week 06/28/23   Shamleffer, Ibtehal Jaralla, MD    Family History Family History  Problem Relation Age of Onset   Colon cancer Mother    Arthritis Mother    Cancer Mother    Early death Mother    Vision loss Father     Social History Social History   Tobacco Use   Smoking status: Former    Types: Cigarettes   Smokeless tobacco: Never  Vaping Use   Vaping status: Never Used  Substance Use Topics   Alcohol use: Yes    Alcohol/week: 2.0 standard drinks of alcohol    Types: 2 Glasses of wine per week    Comment: 1.5- 2 glasses of wine daily   Drug use: No     Allergies   Other, Sulfonamide derivatives, Amoxicillin -pot clavulanate, Azithromycin, Levofloxacin, and Naproxen sodium   Review of Systems Review of Systems  Per HPI  Physical Exam Triage Vital Signs ED Triage Vitals  Encounter Vitals Group     BP 06/27/24 1609 (!) 156/76     Girls Systolic BP Percentile --      Girls Diastolic BP Percentile --      Boys Systolic BP Percentile --      Boys Diastolic BP Percentile --      Pulse Rate 06/27/24 1609 73     Resp 06/27/24 1609 16     Temp 06/27/24 1609 97.7 F (36.5 C)     Temp Source 06/27/24 1609 Oral     SpO2 06/27/24 1609 96 %     Weight 06/27/24 1609 91 lb 8 oz (41.5 kg)     Height 06/27/24 1609 5' 1.5 (1.562 m)     Head Circumference --      Peak Flow --      Pain Score 06/27/24  1615 5     Pain Loc --      Pain Education --      Exclude from Growth Chart --    No data found.  Updated Vital Signs BP (!) 156/76 (BP Location: Left Arm)   Pulse 73   Temp 97.7 F (36.5 C) (Oral)   Resp 16   Ht 5' 1.5 (1.562 m)   Wt 91 lb 8 oz (41.5 kg)   SpO2 96%   BMI 17.01 kg/m   Visual Acuity Right Eye Distance:   Left Eye Distance:  Bilateral Distance:    Right Eye Near:   Left Eye Near:    Bilateral Near:     Physical Exam Vitals and nursing note reviewed.  Constitutional:      Appearance: Normal appearance. She is well-developed.  HENT:     Head: Normocephalic and atraumatic.     Right Ear: External ear normal.     Left Ear: External ear normal.     Nose: Congestion and rhinorrhea present.     Mouth/Throat:     Mouth: Mucous membranes are moist.     Pharynx: Posterior oropharyngeal erythema present.  Eyes:     Conjunctiva/sclera: Conjunctivae normal.  Cardiovascular:     Rate and Rhythm: Normal rate and regular rhythm.     Heart sounds: Normal heart sounds. No murmur heard. Pulmonary:     Effort: Pulmonary effort is normal. No respiratory distress.     Breath sounds: Normal breath sounds.  Skin:    General: Skin is warm and dry.  Neurological:     General: No focal deficit present.     Mental Status: She is alert and oriented to person, place, and time.     GCS: GCS eye subscore is 4. GCS verbal subscore is 5. GCS motor subscore is 6.  Psychiatric:        Mood and Affect: Mood normal.        Behavior: Behavior normal. Behavior is cooperative.      UC Treatments / Results  Labs (all labs ordered are listed, but only abnormal results are displayed) Labs Reviewed  POC COVID19/FLU A&B COMBO  GLUCOSE, POCT (MANUAL RESULT ENTRY)    EKG   Radiology No results found.  Procedures Procedures (including critical care time)  Medications Ordered in UC Medications  dexamethasone  (DECADRON ) injection 10 mg (10 mg Intramuscular Given 06/27/24  1653)    Initial Impression / Assessment and Plan / UC Course  I have reviewed the triage vital signs and the nursing notes.  Pertinent labs & imaging results that were available during my care of the patient were reviewed by me and considered in my medical decision making (see chart for details).  Vitals and triage reviewed, patient is hemodynamically stable.  Lungs vesicular, heart with regular rate and rhythm.  Nasal congestion, rhinorrhea and postnasal drip present.  POC COVID and flu testing negative.  Patient has felt fatigued and worn down, some dizziness.  CBG within normal limits.  Diminished appetite.  Nasal congestion and sinus pressure present, suspect viral sinusitis, most likely due to viral URI.  Symptomatic management discussed.  IM steroid given in clinic to help with sinus pressure.  Nasal spray advised for sneezing.  Plan of care, follow-up care return precautions given, no questions at this time.     Final Clinical Impressions(s) / UC Diagnoses   Final diagnoses:  Sneezing  Nasal congestion     Discharge Instructions      COVID and flu testing were negative, you most likely have a different viral illness.  You can take the Flonase  daily to help with sneezing and allergy symptoms such as congestion.  We have given you a steroid shot which should help with the inflammation and sinus pressure.  For any pain you can take 500 mg of Tylenol  every 6-8 hours as needed.  Sleeping with a humidifier or being in a steamy bathroom can help loosen secretions in addition to drinking at least 64 ounces of water daily.  Symptoms should clear up over the weekend.  If no improvement or any changes follow-up with your primary care provider or return to clinic for reevaluation.      ED Prescriptions     Medication Sig Dispense Auth. Provider   fluticasone  (FLONASE ) 50 MCG/ACT nasal spray Place 1 spray into both nostrils daily. 9.9 mL Dreama, Francesca Strome  N, FNP      PDMP not  reviewed this encounter.   Dreama, Alleigh Mollica  N, FNP 06/27/24 [id number]

## 2024-06-28 ENCOUNTER — Ambulatory Visit: Admitting: Family Medicine

## 2024-06-28 NOTE — Telephone Encounter (Signed)
 Pt is schedule with Corean Ku FNP

## 2024-06-28 NOTE — Progress Notes (Deleted)
   Acute Office Visit  Subjective:     Patient ID: Katelyn Anthony, female    DOB: 10-Jan-1946, 78 y.o.   MRN: [id number]  No chief complaint on file.   HPI  Discussed the use of AI scribe software for clinical note transcription with the patient, who gave verbal consent to proceed.  History of Present Illness      ROS Per HPI      Objective:    There were no vitals taken for this visit.   Physical Exam Vitals and nursing note reviewed.  Constitutional:      General: She is not in acute distress.    Appearance: Normal appearance. She is normal weight.  HENT:     Head: Normocephalic and atraumatic.     Right Ear: External ear normal.     Left Ear: External ear normal.     Nose: Nose normal.     Mouth/Throat:     Mouth: Mucous membranes are moist.     Pharynx: Oropharynx is clear.  Eyes:     Extraocular Movements: Extraocular movements intact.     Pupils: Pupils are equal, round, and reactive to light.  Cardiovascular:     Rate and Rhythm: Normal rate and regular rhythm.     Pulses: Normal pulses.     Heart sounds: Normal heart sounds.  Pulmonary:     Effort: Pulmonary effort is normal. No respiratory distress.     Breath sounds: Normal breath sounds. No wheezing, rhonchi or rales.  Musculoskeletal:        General: Normal range of motion.     Cervical back: Normal range of motion.     Right lower leg: No edema.     Left lower leg: No edema.  Lymphadenopathy:     Cervical: No cervical adenopathy.  Neurological:     General: No focal deficit present.     Mental Status: She is alert and oriented to person, place, and time.  Psychiatric:        Mood and Affect: Mood normal.        Thought Content: Thought content normal.     No results found for any visits on 06/28/24.      Assessment & Plan:   Assessment and Plan Assessment & Plan      No orders of the defined types were placed in this encounter.    No orders of the defined types were placed  in this encounter.   No follow-ups on file.  Corean LITTIE Ku, FNP

## 2024-06-30 ENCOUNTER — Encounter

## 2024-07-05 ENCOUNTER — Other Ambulatory Visit: Payer: Self-pay

## 2024-07-05 ENCOUNTER — Other Ambulatory Visit (HOSPITAL_BASED_OUTPATIENT_CLINIC_OR_DEPARTMENT_OTHER): Payer: Self-pay

## 2024-07-05 ENCOUNTER — Other Ambulatory Visit: Payer: Self-pay | Admitting: Internal Medicine

## 2024-07-05 DIAGNOSIS — E039 Hypothyroidism, unspecified: Secondary | ICD-10-CM

## 2024-07-05 MED ORDER — SYNTHROID 75 MCG PO TABS
75.0000 ug | ORAL_TABLET | ORAL | 3 refills | Status: DC
  Filled 2024-07-05: qty 85, 90d supply, fill #0

## 2024-07-08 ENCOUNTER — Other Ambulatory Visit (HOSPITAL_BASED_OUTPATIENT_CLINIC_OR_DEPARTMENT_OTHER): Payer: Self-pay

## 2024-07-26 ENCOUNTER — Encounter: Payer: Self-pay | Admitting: *Deleted

## 2024-07-26 NOTE — Progress Notes (Signed)
 Katelyn Anthony                                          MRN: [id number]   07/26/2024   The [facility]BCI Quality Team Specialist reviewed this patient medical record for the purposes of chart review for care gap closure. The following were reviewed: chart review for care gap closure-controlling blood pressure.    VBCI Quality Team

## 2024-08-02 ENCOUNTER — Other Ambulatory Visit (HOSPITAL_BASED_OUTPATIENT_CLINIC_OR_DEPARTMENT_OTHER): Payer: Self-pay

## 2024-08-07 NOTE — Progress Notes (Signed)
 Katelyn Anthony                                          MRN: [id number]   08/07/2024   The VBCI Quality Team Specialist reviewed this patient medical record for the purposes of chart review for care gap closure. The following were reviewed: abstraction for care gap closure-controlling blood pressure.    VBCI Quality Team

## 2024-08-13 ENCOUNTER — Encounter: Payer: Self-pay | Admitting: Nurse Practitioner

## 2024-08-22 ENCOUNTER — Telehealth: Admitting: Internal Medicine

## 2024-08-22 ENCOUNTER — Other Ambulatory Visit (HOSPITAL_BASED_OUTPATIENT_CLINIC_OR_DEPARTMENT_OTHER): Payer: Self-pay

## 2024-08-22 ENCOUNTER — Encounter: Payer: Self-pay | Admitting: Internal Medicine

## 2024-08-22 VITALS — Ht 61.5 in | Wt 91.5 lb

## 2024-08-22 DIAGNOSIS — E559 Vitamin D deficiency, unspecified: Secondary | ICD-10-CM | POA: Diagnosis not present

## 2024-08-22 DIAGNOSIS — E039 Hypothyroidism, unspecified: Secondary | ICD-10-CM | POA: Diagnosis not present

## 2024-08-22 MED ORDER — SYNTHROID 75 MCG PO TABS
75.0000 ug | ORAL_TABLET | ORAL | 3 refills | Status: AC
  Filled 2024-08-22: qty 85, 90d supply, fill #0

## 2024-08-22 NOTE — Progress Notes (Signed)
 "  Virtual Visit via Video Note  I connected with.name on 08/22/24 at 11:30 AM EST by a video enabled telemedicine application and verified that I am speaking with the correct person using two identifiers.   I discussed the limitations of evaluation and management by telemedicine and the availability of in person appointments. The patient expressed understanding and agreed to proceed.  -Location of the patient : home -Location of the provider : Office -The names of all persons participating in the telemedicine service : Pt and myself         Name: Katelyn Anthony  MRN/ DOB: [id number], 1946-02-20    Age/ Sex: 79 y.o., female     PCP: Elnor Lauraine BRAVO, NP   Reason for Endocrinology Evaluation: Hypothyroidism     Initial Endocrinology Clinic Visit: 11/26/2019    PATIENT IDENTIFIER: Katelyn Anthony is a 79 y.o., female with a past medical history of  hypothyroidism, dyslipidemia and MVP. She has followed with [facility] since 11/26/2019  for consultative assistance with management of her Hypothyroidism.   HISTORICAL SUMMARY:  She has been diagnosed with hypothyroidism since 1990'. She uses brand only .   SUBJECTIVE:    Today (08/22/2024):  [name] is here for hypothyroidism.  Developed neuropathy of the left foot in 09/2020  for following a callus removal that she attributes to the acid application.  She has tried Lyrica , gabapentin , without help.  She eventually saw [facility] in 2024.  She continues to follow-up with podiatry, pain has been improving with the appropriate shoes and padding   The patient is under the impression she is on Synthroid  half a tablet on Sundays due to elevated BP.  No local neck symptoms No palpitations Has chronic constipation No hand tremors but has stiffness and pain   Synthroid  75 mcg , HALF a tablet on Sundays and 1 tablet the rest of the week MVI daily   HISTORY:  Past Medical History:  Past Medical History:  Diagnosis Date    Allergy 1954   feathers   Cataract    Cholelithiasis with cholecystitis    Chronic   Hypothyroidism    MVP (mitral valve prolapse)    Osteopenia    Pneumonia    Thyroid  disease 1995   Past Surgical History:  Past Surgical History:  Procedure Laterality Date   ABDOMINAL HYSTERECTOMY  1992   APPENDECTOMY  1992   BREAST BIOPSY Left 05/17/2017   CHOLECYSTECTOMY N/A 01/10/2018   Procedure: LAPAROSCOPIC CHOLECYSTECTOMY WITH INTRAOPERATIVE CHOLANGIOGRAM;  Surgeon: Mikell Katz, MD;  Location: [facility] OR;  Service: General;  Laterality: N/A;   COLON SURGERY  1992   diverticulitis   COLONOSCOPY WITH PROPOFOL  N/A 03/09/2016   Procedure: COLONOSCOPY WITH PROPOFOL ;  Surgeon: Elsie Cree, MD;  Location: [facility] ENDOSCOPY;  Service: Endoscopy;  Laterality: N/A;   TONSILLECTOMY AND ADENOIDECTOMY     Social History:  reports that she has quit smoking. Her smoking use included cigarettes. She has never used smokeless tobacco. She reports current alcohol use of about 2.0 standard drinks of alcohol per week. She reports that she does not use drugs. Family History:  Family History  Problem Relation Age of Onset   Colon cancer Mother    Arthritis Mother    Cancer Mother    Early death Mother    Vision loss Father      HOME MEDICATIONS: Allergies as of 08/22/2024       Reactions   Other    Other reaction(s): unknown  Sulfonamide Derivatives Other (See Comments)   Amoxicillin -pot Clavulanate Diarrhea    muscle pain, can tolerate short durations of amoxicillin   Has patient had a PCN reaction causing immediate rash, facial/tongue/throat swelling, SOB or lightheadedness with hypotension: No Has patient had a PCN reaction causing severe rash involving mucus membranes or skin necrosis: No Has patient had a PCN reaction that required hospitalization: Unknown Has patient had a PCN reaction occurring within the last 10 years: Unknown   Azithromycin Hives   Levofloxacin Other (See Comments)    REACTION: itchy skin and bloody stools   Naproxen Sodium Rash        Medication List        Accurate as of August 22, 2024  7:37 AM. If you have any questions, ask your nurse or doctor.          Ciclopirox  0.77 % gel Apply 1 Application topically 2 (two) times daily.   docusate sodium  100 MG capsule Commonly known as: COLACE Take 1 capsule (100 mg total) by mouth every 12 (twelve) hours.   estradiol  0.1 MG/24HR patch Commonly known as: VIVELLE -DOT Place 1 patch onto the skin once a week.   fluticasone  50 MCG/ACT nasal spray Commonly known as: FLONASE  Place 1 spray into both nostrils daily.   multivitamin with minerals Tabs tablet Take 1 tablet by mouth daily.   NONFORMULARY OR COMPOUNDED ITEM Elimipure   senna 8.6 MG Tabs tablet Commonly known as: SENOKOT Take 1 tablet by mouth.   Synthroid  75 MCG tablet Generic drug: levothyroxine  Take 1 tablet (75 mcg total) by mouth as directed. Half a tablet on Sundays, 1 tablet rest of the week   Vitamin D -3 25 MCG (1000 UT) Caps Take by mouth.          OBJECTIVE:   PHYSICAL EXAM: VS: There were no vitals taken for this visit.   EXAM: General: Pt appears well and is in NAD  Mental Status: Judgment, insight: Intact Orientation: Oriented to time, place, and person Mood and affect: No depression, anxiety, or agitation     DATA REVIEWED:   ASSESSMENT / PLAN / RECOMMENDATIONS:   Hypothyroidism :   -I did explain to the patient that the reason she is on half a tablet of Synthroid  once a week is not due to blood pressure but rather due to a low TSH in the past - Patient will stop by the office next week for repeat labs - No changes at this time  Medications   Continue Synthroid  75 mcg , HALF a tablet on Sundays and 1 tablet the rest of the week   2. Vitamin insufficiency:  -Patient would like to have vitamin B-12 and D checked - She is on a multivitamin    F/U in 1 yr   Signed electronically  by: Stefano Redgie Butts, MD  [facility]  [facility] [address] [place] [place], [place] [address] Phone: [phone] FAX: [phone]      CC: Elnor Lauraine BRAVO, NP [address] [place] [address] Phone: [phone]  Fax: [phone]   Return to Endocrinology clinic as below: Future Appointments  Date Time Provider Department Center  08/22/2024 11:30 AM Valecia Beske, Donell Redgie, MD [id number] None       "

## 2024-08-27 ENCOUNTER — Ambulatory Visit: Payer: Medicare HMO | Admitting: Internal Medicine

## 2024-08-27 ENCOUNTER — Other Ambulatory Visit

## 2024-08-29 ENCOUNTER — Other Ambulatory Visit

## 2024-09-05 ENCOUNTER — Ambulatory Visit: Payer: Medicare HMO | Admitting: Nurse Practitioner

## 2024-09-05 ENCOUNTER — Other Ambulatory Visit
